# Patient Record
Sex: Female | Born: 1975 | Race: White | Hispanic: No | Marital: Single | Smoking: Never smoker
Health system: Southern US, Community
[De-identification: ages and names within clinical notes are randomized; demographics above are authoritative.]

## PROBLEM LIST (undated history)

## (undated) DIAGNOSIS — A4902 Methicillin resistant Staphylococcus aureus infection, unspecified site: Secondary | ICD-10-CM

## (undated) DIAGNOSIS — F988 Other specified behavioral and emotional disorders with onset usually occurring in childhood and adolescence: Secondary | ICD-10-CM

## (undated) DIAGNOSIS — K259 Gastric ulcer, unspecified as acute or chronic, without hemorrhage or perforation: Secondary | ICD-10-CM

## (undated) DIAGNOSIS — N281 Cyst of kidney, acquired: Secondary | ICD-10-CM

## (undated) DIAGNOSIS — F1120 Opioid dependence, uncomplicated: Secondary | ICD-10-CM

## (undated) DIAGNOSIS — M549 Dorsalgia, unspecified: Secondary | ICD-10-CM

## (undated) HISTORY — PX: HEMORRHOID SURGERY: SHX153

---

## 1998-01-29 ENCOUNTER — Emergency Department (HOSPITAL_COMMUNITY): Admission: EM | Admit: 1998-01-29 | Discharge: 1998-01-29 | Payer: Self-pay | Admitting: Emergency Medicine

## 1999-01-28 ENCOUNTER — Emergency Department (HOSPITAL_COMMUNITY): Admission: EM | Admit: 1999-01-28 | Discharge: 1999-01-28 | Payer: Self-pay | Admitting: Emergency Medicine

## 1999-11-23 ENCOUNTER — Other Ambulatory Visit: Admission: RE | Admit: 1999-11-23 | Discharge: 1999-11-23 | Payer: Self-pay | Admitting: Gynecology

## 2001-05-12 ENCOUNTER — Other Ambulatory Visit: Admission: RE | Admit: 2001-05-12 | Discharge: 2001-05-12 | Payer: Self-pay | Admitting: *Deleted

## 2014-10-30 ENCOUNTER — Emergency Department (HOSPITAL_BASED_OUTPATIENT_CLINIC_OR_DEPARTMENT_OTHER)
Admission: EM | Admit: 2014-10-30 | Discharge: 2014-10-31 | Discharge status: Against Medical Advice | Payer: Medicaid Other | Attending: Emergency Medicine | Admitting: Emergency Medicine

## 2014-10-30 ENCOUNTER — Encounter (HOSPITAL_BASED_OUTPATIENT_CLINIC_OR_DEPARTMENT_OTHER): Payer: Self-pay

## 2014-10-30 DIAGNOSIS — M545 Low back pain: Secondary | ICD-10-CM | POA: Diagnosis not present

## 2014-10-30 HISTORY — DX: Cyst of kidney, acquired: N28.1

## 2014-10-30 HISTORY — DX: Gastric ulcer, unspecified as acute or chronic, without hemorrhage or perforation: K25.9

## 2014-10-30 HISTORY — DX: Dorsalgia, unspecified: M54.9

## 2014-10-30 HISTORY — DX: Methicillin resistant Staphylococcus aureus infection, unspecified site: A49.02

## 2014-10-30 HISTORY — DX: Other specified behavioral and emotional disorders with onset usually occurring in childhood and adolescence: F98.8

## 2014-10-30 LAB — URINE MICROSCOPIC-ADD ON

## 2014-10-30 LAB — URINALYSIS, ROUTINE W REFLEX MICROSCOPIC
Bilirubin Urine: NEGATIVE
GLUCOSE, UA: NEGATIVE mg/dL
HGB URINE DIPSTICK: NEGATIVE
KETONES UR: NEGATIVE mg/dL
Nitrite: NEGATIVE
PROTEIN: NEGATIVE mg/dL
Specific Gravity, Urine: 1.026 (ref 1.005–1.030)
Urobilinogen, UA: 1 mg/dL (ref 0.0–1.0)
pH: 5 (ref 5.0–8.0)

## 2014-10-30 LAB — PREGNANCY, URINE: PREG TEST UR: NEGATIVE

## 2014-10-30 NOTE — ED Notes (Signed)
twisted back moving boxes yesterday-lower back pain

## 2014-10-30 NOTE — ED Notes (Signed)
Per registration pt left

## 2019-04-28 ENCOUNTER — Emergency Department (HOSPITAL_BASED_OUTPATIENT_CLINIC_OR_DEPARTMENT_OTHER): Payer: Medicaid Other

## 2019-04-28 ENCOUNTER — Emergency Department (HOSPITAL_BASED_OUTPATIENT_CLINIC_OR_DEPARTMENT_OTHER)
Admission: EM | Admit: 2019-04-28 | Discharge: 2019-04-29 | Disposition: A | Discharge status: Home / Self Care | Payer: Medicaid Other | Attending: Emergency Medicine | Admitting: Emergency Medicine

## 2019-04-28 ENCOUNTER — Other Ambulatory Visit: Payer: Self-pay

## 2019-04-28 ENCOUNTER — Encounter (HOSPITAL_BASED_OUTPATIENT_CLINIC_OR_DEPARTMENT_OTHER): Payer: Self-pay | Admitting: Emergency Medicine

## 2019-04-28 DIAGNOSIS — L02413 Cutaneous abscess of right upper limb: Secondary | ICD-10-CM | POA: Diagnosis not present

## 2019-04-28 DIAGNOSIS — Z23 Encounter for immunization: Secondary | ICD-10-CM | POA: Insufficient documentation

## 2019-04-28 DIAGNOSIS — L03113 Cellulitis of right upper limb: Secondary | ICD-10-CM

## 2019-04-28 LAB — CBC WITH DIFFERENTIAL/PLATELET
Abs Immature Granulocytes: 0.05 10*3/uL (ref 0.00–0.07)
Basophils Absolute: 0 10*3/uL (ref 0.0–0.1)
Basophils Relative: 0 %
Eosinophils Absolute: 0.1 10*3/uL (ref 0.0–0.5)
Eosinophils Relative: 2 %
HCT: 36.6 % (ref 36.0–46.0)
Hemoglobin: 11.9 g/dL — ABNORMAL LOW (ref 12.0–15.0)
Immature Granulocytes: 1 %
Lymphocytes Relative: 15 %
Lymphs Abs: 1.2 10*3/uL (ref 0.7–4.0)
MCH: 29.5 pg (ref 26.0–34.0)
MCHC: 32.5 g/dL (ref 30.0–36.0)
MCV: 90.6 fL (ref 80.0–100.0)
Monocytes Absolute: 0.6 10*3/uL (ref 0.1–1.0)
Monocytes Relative: 8 %
Neutro Abs: 5.8 10*3/uL (ref 1.7–7.7)
Neutrophils Relative %: 74 %
Platelets: 347 10*3/uL (ref 150–400)
RBC: 4.04 MIL/uL (ref 3.87–5.11)
RDW: 13.2 % (ref 11.5–15.5)
WBC: 7.8 10*3/uL (ref 4.0–10.5)
nRBC: 0 % (ref 0.0–0.2)

## 2019-04-28 LAB — URINALYSIS, ROUTINE W REFLEX MICROSCOPIC
Bilirubin Urine: NEGATIVE
Glucose, UA: NEGATIVE mg/dL
Hgb urine dipstick: NEGATIVE
Ketones, ur: NEGATIVE mg/dL
Leukocytes,Ua: NEGATIVE
Nitrite: NEGATIVE
Protein, ur: NEGATIVE mg/dL
Specific Gravity, Urine: >1.03 — ABNORMAL HIGH (ref 1.005–1.030)
pH: 5.5 (ref 5.0–8.0)

## 2019-04-28 LAB — COMPREHENSIVE METABOLIC PANEL
ALT: 16 U/L (ref 0–44)
AST: 16 U/L (ref 15–41)
Albumin: 3.4 g/dL — ABNORMAL LOW (ref 3.5–5.0)
Alkaline Phosphatase: 83 U/L (ref 38–126)
Anion gap: 11 (ref 5–15)
BUN: 13 mg/dL (ref 6–20)
CO2: 27 mmol/L (ref 22–32)
Calcium: 9 mg/dL (ref 8.9–10.3)
Chloride: 99 mmol/L (ref 98–111)
Creatinine, Ser: 0.67 mg/dL (ref 0.44–1.00)
GFR calc Af Amer: >60 mL/min (ref >60)
GFR calc non Af Amer: >60 mL/min (ref >60)
Glucose, Bld: 95 mg/dL (ref 70–99)
Potassium: 3.6 mmol/L (ref 3.5–5.1)
Sodium: 137 mmol/L (ref 135–145)
Total Bilirubin: 0.4 mg/dL (ref 0.3–1.2)
Total Protein: 7.7 g/dL (ref 6.5–8.1)

## 2019-04-28 LAB — PROTIME-INR
INR: 1 (ref 0.8–1.2)
Prothrombin Time: 12.7 seconds (ref 11.4–15.2)

## 2019-04-28 LAB — LACTIC ACID, PLASMA: Lactic Acid, Venous: 1 mmol/L (ref 0.5–1.9)

## 2019-04-28 LAB — APTT: aPTT: 33 seconds (ref 24–36)

## 2019-04-28 LAB — PREGNANCY, URINE: Preg Test, Ur: NEGATIVE

## 2019-04-28 MED ORDER — SODIUM CHLORIDE 0.9 % IV BOLUS (SEPSIS)
500.0000 mL | Freq: Once | INTRAVENOUS | Status: AC
  Administered 2019-04-28: 500 mL via INTRAVENOUS

## 2019-04-28 MED ORDER — VANCOMYCIN HCL IN DEXTROSE 750-5 MG/150ML-% IV SOLN
750.0000 mg | Freq: Two times a day (BID) | INTRAVENOUS | Status: DC
  Filled 2019-04-28: qty 150

## 2019-04-28 MED ORDER — SODIUM CHLORIDE 0.9 % IV BOLUS (SEPSIS)
1000.0000 mL | Freq: Once | INTRAVENOUS | Status: AC
  Administered 2019-04-28: 1000 mL via INTRAVENOUS

## 2019-04-28 MED ORDER — SODIUM CHLORIDE 0.9 % IV SOLN
2.0000 g | Freq: Once | INTRAVENOUS | Status: AC
  Administered 2019-04-28: 2 g via INTRAVENOUS
  Filled 2019-04-28: qty 20

## 2019-04-28 MED ORDER — VANCOMYCIN HCL IN DEXTROSE 1-5 GM/200ML-% IV SOLN
1000.0000 mg | Freq: Once | INTRAVENOUS | Status: AC
  Administered 2019-04-28: 1000 mg via INTRAVENOUS
  Filled 2019-04-28: qty 200

## 2019-04-28 MED ORDER — IBUPROFEN 600 MG PO TABS: 600.0000 mg | ORAL_TABLET | Freq: Four times a day (QID) | ORAL | 0 refills | Status: AC | PRN

## 2019-04-28 MED ORDER — MORPHINE SULFATE (PF) 4 MG/ML IV SOLN
4.0000 mg | Freq: Once | INTRAVENOUS | Status: AC
  Administered 2019-04-28: 4 mg via INTRAVENOUS
  Filled 2019-04-28: qty 1

## 2019-04-28 MED ORDER — SODIUM CHLORIDE 0.9 % IV BOLUS (SEPSIS)
250.0000 mL | Freq: Once | INTRAVENOUS | Status: AC
  Administered 2019-04-28: 250 mL via INTRAVENOUS

## 2019-04-28 MED ORDER — SODIUM CHLORIDE 0.9 % IV SOLN
INTRAVENOUS | Status: DC | PRN
  Administered 2019-04-28: 250 mL via INTRAVENOUS

## 2019-04-28 MED ORDER — TETANUS-DIPHTH-ACELL PERTUSSIS 5-2.5-18.5 LF-MCG/0.5 IM SUSP
0.5000 mL | Freq: Once | INTRAMUSCULAR | Status: AC
  Administered 2019-04-28: 0.5 mL via INTRAMUSCULAR
  Filled 2019-04-28: qty 0.5

## 2019-04-28 MED ORDER — LIDOCAINE-EPINEPHRINE (PF) 2 %-1:200000 IJ SOLN
10.0000 mL | Freq: Once | INTRAMUSCULAR | Status: AC
  Administered 2019-04-28: 10 mL via INTRADERMAL
  Filled 2019-04-28 (×2): qty 10

## 2019-04-28 MED ORDER — CLINDAMYCIN HCL 300 MG PO CAPS: 300.0000 mg | ORAL_CAPSULE | Freq: Four times a day (QID) | ORAL | 0 refills | Status: DC

## 2019-04-28 NOTE — ED Triage Notes (Addendum)
Pt states that she has been trying to stop using IV drugs. Her suboxone was stolen so she shot up 2 days ago. Now she has a swollen abscess looking area to her right AC area. She has not shot up in that arm though. 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

## 2019-04-28 NOTE — ED Provider Notes (Signed)
[facility] Provider Note   CSN: [id number] Arrival date & time: 04/28/19  2050     History   Chief Complaint Chief Complaint  Patient presents with  . Abscess    HPI Crystal Aguirre is a 43 y.o. female.     The history is provided by the patient. No language interpreter was used.  Abscess    43 year old female with history of IV drug use, MRSA, presenting for evaluation of skin infection.  Patient admits that she used IV drugs last use was 2 days ago.  She developed an infection to her right antecubital region approximately 4 days ago which has become progressively worse.  She tries taking leftover clindamycin at home without adequate relief.  She mention she is actively trying to stop using IV drugs but her Suboxone was recently stolen.  She is scheduled to follow-up with Dr. in 2 days for further care.  She currently denies having fever, headache, chest pain, trouble breathing.  She does complain of sharp throbbing 8 out of 10 pain throughout her right arm.  She is not up-to-date with tetanus.  Past Medical History:  Diagnosis Date  . ADD (attention deficit disorder)   . Back pain   . Gastric ulcer   . Kidney cysts   . MRSA infection     There are no active problems to display for this patient.   Past Surgical History:  Procedure Laterality Date  . HEMORRHOID SURGERY       OB History   No obstetric history on file.      Home Medications    Prior to Admission medications   Medication Sig Start Date End Date Taking? Authorizing Provider  Amphetamine-Dextroamphetamine (ADDERALL PO) Take by mouth.    [provider]  Dexlansoprazole (DEXILANT PO) Take by mouth.    [provider]  Oxycodone-Acetaminophen (PERCOCET PO) Take by mouth.    [provider]    Family History No family history on file.  Social History Social History   Tobacco Use  . Smoking status: Never Smoker  Substance Use Topics   . Alcohol use: Yes  . Drug use: No     Allergies   Sulfa antibiotics   Review of Systems Review of Systems  All other systems reviewed and are negative.    Physical Exam Updated Vital Signs BP 102/75   Pulse (!) 125   Temp 98.5 F (36.9 C) (Oral)   Resp 18   Ht 5\' 3"  (1.6 m)   Wt 56.2 kg   SpO2 97%   BMI 21.97 kg/m   Physical Exam Vitals signs and nursing note reviewed.  Constitutional:      General: She is not in acute distress.    Appearance: She is well-developed.  HENT:     Head: Atraumatic.  Eyes:     Conjunctiva/sclera: Conjunctivae normal.  Neck:     Musculoskeletal: Neck supple.  Cardiovascular:     Rate and Rhythm: Tachycardia present.     Heart sounds: Murmur present.  Pulmonary:     Effort: Pulmonary effort is normal.     Breath sounds: Normal breath sounds.  Abdominal:     General: Abdomen is flat.     Palpations: Abdomen is soft.     Tenderness: There is no abdominal tenderness.  Skin:    Findings: Rash present.     Comments: Right arm: Area of induration and fluctuant noted to the antecubital region approximately 5 cm in diameter exquisitely  tenderness to palpation.  Surrounding skin erythema expanding to most medial aspect of arm from proximal upper arm to distal forearm without hand involvement.  No circumferential cellulitis.  Radial pulse 2+.  Brisk cap refill to fingers.  Neurological:     Mental Status: She is alert.      ED Treatments / Results  Labs (all labs ordered are listed, but only abnormal results are displayed) Labs Reviewed  COMPREHENSIVE METABOLIC PANEL - Abnormal; Notable for the following components:      Result Value   Albumin 3.4 (*)    All other components within normal limits  CBC WITH DIFFERENTIAL/PLATELET - Abnormal; Notable for the following components:   Hemoglobin 11.9 (*)    All other components within normal limits  CULTURE, BLOOD (ROUTINE X 2)  CULTURE, BLOOD (ROUTINE X 2)  URINE CULTURE  AEROBIC  CULTURE (SUPERFICIAL SPECIMEN)  LACTIC ACID, PLASMA  APTT  PROTIME-INR  URINALYSIS, ROUTINE W REFLEX MICROSCOPIC  PREGNANCY, URINE    EKG EKG Interpretation  Date/Time:  Saturday April 28 2019 21:26:04 EDT Ventricular Rate:  102 PR Interval:    QRS Duration: 79 QT Interval:  319 QTC Calculation: 416 R Axis:   70 Text Interpretation:  Sinus tachycardia No prior ECG for comparison.  No STEMI Confirmed by Theda Belfastegeler, Chris ([id number]) on 04/28/2019 10:37:19 PM   Radiology Dg Chest Port 1 View  Result Date: 04/28/2019 CLINICAL DATA:  IV drug use. Heart murmur. EXAM: PORTABLE CHEST 1 VIEW COMPARISON:  None. FINDINGS: The cardiomediastinal contours are normal. Heart is normal in size. The lungs are clear. Pulmonary vasculature is normal. No consolidation, pleural effusion, or pneumothorax. No acute osseous abnormalities are seen. IMPRESSION: Negative portable AP view of the chest. Electronically Signed   By: Narda RutherfordMelanie  Sanford M.D.   On: 04/28/2019 21:42    Procedures .Marland Kitchen.Incision and Drainage  Date/Time: 04/28/2019 9:58 PM Performed by: Fayrene Helperran, Phyllistine Domingos, PA-C Authorized by: Fayrene Helperran, Elio Haden, PA-C   Consent:    Consent obtained:  Verbal   Consent given by:  Patient   Risks discussed:  Bleeding, incomplete drainage, pain and damage to other organs   Alternatives discussed:  No treatment Universal protocol:    Procedure explained and questions answered to patient or proxy's satisfaction: yes     Relevant documents present and verified: yes     Test results available and properly labeled: yes     Imaging studies available: yes     Required blood products, implants, devices, and special equipment available: yes     Site/side marked: yes     Immediately prior to procedure a time out was called: yes     Patient identity confirmed:  Verbally with patient Location:    Type:  Abscess   Size:  5cm   Location:  Upper extremity   Upper extremity location:  Elbow   Elbow location:  R elbow (R  antecubital) Pre-procedure details:    Skin preparation:  Betadine Anesthesia (see MAR for exact dosages):    Anesthesia method:  Local infiltration   Local anesthetic:  Lidocaine 1% WITH epi Procedure type:    Complexity:  Complex Procedure details:    Incision types:  Single straight   Incision depth:  Subcutaneous   Scalpel blade:  11   Wound management:  Probed and deloculated, irrigated with saline and extensive cleaning   Drainage:  Purulent   Drainage amount:  Copious   Packing materials:  1/4 in gauze and 1/2 in iodoform gauze Post-procedure details:  Patient tolerance of procedure:  Tolerated with difficulty   (including critical care time)  Medications Ordered in ED Medications  vancomycin (VANCOCIN) IVPB 1000 mg/200 mL premix (1,000 mg Intravenous New Bag/Given 04/28/19 2224)  0.9 %  sodium chloride infusion (250 mLs Intravenous New Bag/Given 04/28/19 2222)  vancomycin (VANCOCIN) IVPB 750 mg/150 ml premix (has no administration in time range)  Tdap (BOOSTRIX) injection 0.5 mL (0.5 mLs Intramuscular Given 04/28/19 2227)  sodium chloride 0.9 % bolus 1,000 mL (1,000 mLs Intravenous New Bag/Given 04/28/19 2211)    And  sodium chloride 0.9 % bolus 500 mL (500 mLs Intravenous New Bag/Given 04/28/19 2216)    And  sodium chloride 0.9 % bolus 250 mL (0 mLs Intravenous Stopped 04/28/19 2217)  cefTRIAXone (ROCEPHIN) 2 g in sodium chloride 0.9 % 100 mL IVPB (2 g Intravenous New Bag/Given 04/28/19 2216)  lidocaine-EPINEPHrine (XYLOCAINE W/EPI) 2 %-1:200000 (PF) injection 10 mL (10 mLs Intradermal Given by Other 04/28/19 2155)  morphine 4 MG/ML injection 4 mg (4 mg Intravenous Given 04/28/19 2206)     Initial Impression / Assessment and Plan / ED Course  I have reviewed the triage vital signs and the nursing notes.  Pertinent labs & imaging results that were available during my care of the patient were reviewed by me and considered in my medical decision making (see chart for details).         BP 117/77   Pulse 96   Temp 98.5 F (36.9 C) (Oral)   Resp 13   Ht 5\' 3"  (1.6 m)   Wt 56.2 kg   SpO2 100%   BMI 21.97 kg/m    Final Clinical Impressions(s) / ED Diagnoses   Final diagnoses:  Abscess of upper arm and forearm, right  Cellulitis of right arm    ED Discharge Orders         Ordered    clindamycin (CLEOCIN) 300 MG capsule  4 times daily     04/28/19 2245    ibuprofen (ADVIL) 600 MG tablet  Every 6 hours PRN     04/28/19 2245         9:27 PM Patient with significant history of IV drug use here with right arm infection.  She developed a cutaneous abscess to her right AC region with surrounding skin cellulitis involving most of her upper arm.  She was found to be tachycardic with heart rate of 125.  Soft blood pressure with a blood pressure of 102/75.  She is afebrile.  She does have heart murmur on exam but no active chest pain to suggest endocarditis.  Code sepsis initiated, will initiate antibiotic and will perform incision and drainage of her abscess.  9:59 PM Successful incision and drainage of the abscess.  Copious amount of purulent discharge noted.  Patient tolerates without difficulty.  10:01 PM Fortunately white count is normal, lactic acid is normal, electrolyte panels are reassuring and chest x-ray unremarkable. Code sepsis cancelled.  This patient does not want to be admitted to the hospital, will continue with antibiotic and discharge home with antibiotic with close follow-up.  Packing to be removed in 2 days. Since pt started on the first day of clindamycin, will continue with this antibiotic.     Fayrene Helperran, Lennyn Gange, PA-C 04/28/19 2247    Tegeler, Canary Brimhristopher J, MD 04/29/19 Marlyne [id number]

## 2019-04-28 NOTE — Discharge Instructions (Signed)
You have been diagnosed with an abscess to your right elbow with surrounding cellulitis.  Apply warm moist compress to affected area several times daily to help with healing.  Return to the ER in 2 days for wound recheck and packing removal.  Return sooner if you have any concerns.

## 2019-04-28 NOTE — Progress Notes (Signed)
Pharmacy Antibiotic Note  Crystal Aguirre is a 43 y.o. female admitted on 04/28/2019 with cellulitis.  Pharmacy has been consulted for vancomycin dosing. Pt has hx IVDU, Cr <1, cultures pending.  Plan: Vancomycin 1000mg  IV x1 then 750mg  IV q12h Ceftriaxone 2g IV  x1 per MD  Height: 5\' 3"  (160 cm) Weight: 124 lb (56.2 kg) IBW/kg (Calculated) : 52.4  Temp (24hrs), Avg:98.5 F (36.9 C), Min:98.5 F (36.9 C), Max:98.5 F (36.9 C)  Recent Labs  Lab 04/28/19 2200  WBC 7.8  CREATININE 0.67  LATICACIDVEN 1.0    Estimated Creatinine Clearance: 75.8 mL/min (by C-G formula based on SCr of 0.67 mg/dL).    Allergies  Allergen Reactions  . Sulfa Antibiotics Other (See Comments)    Joint pain    Antimicrobials this admission: Vancomycin 8/15 >>  Microbiology results: sent  Thank you for allowing pharmacy to be a part of this patient's care.   Arrie Senate, PharmD, BCPS Clinical Pharmacist Please check AMION for all [facility] Pharm[facility] numbers 04/28/2019

## 2019-04-28 NOTE — ED Triage Notes (Signed)
One day of PCN and then another day of clindamycin

## 2019-04-30 LAB — URINE CULTURE: Culture: NO GROWTH

## 2019-05-01 LAB — AEROBIC CULTURE W GRAM STAIN (SUPERFICIAL SPECIMEN): Culture: NORMAL

## 2019-05-04 LAB — CULTURE, BLOOD (ROUTINE X 2)
Culture: NO GROWTH
Culture: NO GROWTH
Special Requests: ADEQUATE
Special Requests: ADEQUATE

## 2019-06-20 ENCOUNTER — Encounter (HOSPITAL_BASED_OUTPATIENT_CLINIC_OR_DEPARTMENT_OTHER): Payer: Self-pay | Admitting: *Deleted

## 2019-06-20 ENCOUNTER — Other Ambulatory Visit: Payer: Self-pay

## 2019-06-20 ENCOUNTER — Inpatient Hospital Stay (HOSPITAL_BASED_OUTPATIENT_CLINIC_OR_DEPARTMENT_OTHER)
Admission: EM | Admit: 2019-06-20 | Discharge: 2019-06-24 | DRG: 442 | Discharge status: Against Medical Advice | Payer: Medicaid Other | Attending: Internal Medicine | Admitting: Internal Medicine

## 2019-06-20 DIAGNOSIS — F1910 Other psychoactive substance abuse, uncomplicated: Secondary | ICD-10-CM | POA: Diagnosis present

## 2019-06-20 DIAGNOSIS — Z79899 Other long term (current) drug therapy: Secondary | ICD-10-CM

## 2019-06-20 DIAGNOSIS — E876 Hypokalemia: Secondary | ICD-10-CM | POA: Diagnosis present

## 2019-06-20 DIAGNOSIS — G2581 Restless legs syndrome: Secondary | ICD-10-CM | POA: Diagnosis present

## 2019-06-20 DIAGNOSIS — D689 Coagulation defect, unspecified: Secondary | ICD-10-CM | POA: Diagnosis present

## 2019-06-20 DIAGNOSIS — Z791 Long term (current) use of non-steroidal anti-inflammatories (NSAID): Secondary | ICD-10-CM

## 2019-06-20 DIAGNOSIS — R7689 Other specified abnormal immunological findings in serum: Secondary | ICD-10-CM | POA: Diagnosis present

## 2019-06-20 DIAGNOSIS — B179 Acute viral hepatitis, unspecified: Secondary | ICD-10-CM

## 2019-06-20 DIAGNOSIS — R7401 Elevation of levels of liver transaminase levels: Secondary | ICD-10-CM

## 2019-06-20 DIAGNOSIS — F988 Other specified behavioral and emotional disorders with onset usually occurring in childhood and adolescence: Secondary | ICD-10-CM | POA: Diagnosis present

## 2019-06-20 DIAGNOSIS — R768 Other specified abnormal immunological findings in serum: Secondary | ICD-10-CM | POA: Diagnosis present

## 2019-06-20 DIAGNOSIS — Z20828 Contact with and (suspected) exposure to other viral communicable diseases: Secondary | ICD-10-CM | POA: Diagnosis present

## 2019-06-20 DIAGNOSIS — E871 Hypo-osmolality and hyponatremia: Secondary | ICD-10-CM | POA: Diagnosis present

## 2019-06-20 DIAGNOSIS — Z5329 Procedure and treatment not carried out because of patient's decision for other reasons: Secondary | ICD-10-CM | POA: Diagnosis not present

## 2019-06-20 DIAGNOSIS — B159 Hepatitis A without hepatic coma: Secondary | ICD-10-CM | POA: Diagnosis present

## 2019-06-20 DIAGNOSIS — B1710 Acute hepatitis C without hepatic coma: Principal | ICD-10-CM | POA: Diagnosis present

## 2019-06-20 DIAGNOSIS — F1110 Opioid abuse, uncomplicated: Secondary | ICD-10-CM | POA: Diagnosis present

## 2019-06-20 DIAGNOSIS — E860 Dehydration: Secondary | ICD-10-CM | POA: Diagnosis present

## 2019-06-20 DIAGNOSIS — E861 Hypovolemia: Secondary | ICD-10-CM | POA: Diagnosis present

## 2019-06-20 DIAGNOSIS — E279 Disorder of adrenal gland, unspecified: Secondary | ICD-10-CM | POA: Diagnosis present

## 2019-06-20 HISTORY — DX: Opioid dependence, uncomplicated: F11.20

## 2019-06-20 NOTE — ED Triage Notes (Signed)
Pt c/o n/v x 2 days, recently stop heroin use x 3 days ago

## 2019-06-21 ENCOUNTER — Emergency Department (HOSPITAL_BASED_OUTPATIENT_CLINIC_OR_DEPARTMENT_OTHER): Payer: Medicaid Other

## 2019-06-21 ENCOUNTER — Encounter (HOSPITAL_COMMUNITY): Payer: Self-pay | Admitting: Family Medicine

## 2019-06-21 ENCOUNTER — Inpatient Hospital Stay (HOSPITAL_BASED_OUTPATIENT_CLINIC_OR_DEPARTMENT_OTHER): Payer: Medicaid Other

## 2019-06-21 DIAGNOSIS — E871 Hypo-osmolality and hyponatremia: Secondary | ICD-10-CM | POA: Diagnosis present

## 2019-06-21 DIAGNOSIS — D689 Coagulation defect, unspecified: Secondary | ICD-10-CM | POA: Diagnosis present

## 2019-06-21 DIAGNOSIS — E279 Disorder of adrenal gland, unspecified: Secondary | ICD-10-CM | POA: Diagnosis present

## 2019-06-21 DIAGNOSIS — Z20828 Contact with and (suspected) exposure to other viral communicable diseases: Secondary | ICD-10-CM | POA: Diagnosis present

## 2019-06-21 DIAGNOSIS — B1710 Acute hepatitis C without hepatic coma: Secondary | ICD-10-CM | POA: Diagnosis present

## 2019-06-21 DIAGNOSIS — F1110 Opioid abuse, uncomplicated: Secondary | ICD-10-CM | POA: Diagnosis present

## 2019-06-21 DIAGNOSIS — B179 Acute viral hepatitis, unspecified: Secondary | ICD-10-CM | POA: Diagnosis present

## 2019-06-21 DIAGNOSIS — Z791 Long term (current) use of non-steroidal anti-inflammatories (NSAID): Secondary | ICD-10-CM | POA: Diagnosis not present

## 2019-06-21 DIAGNOSIS — F988 Other specified behavioral and emotional disorders with onset usually occurring in childhood and adolescence: Secondary | ICD-10-CM | POA: Diagnosis present

## 2019-06-21 DIAGNOSIS — Z79899 Other long term (current) drug therapy: Secondary | ICD-10-CM | POA: Diagnosis not present

## 2019-06-21 DIAGNOSIS — Z5329 Procedure and treatment not carried out because of patient's decision for other reasons: Secondary | ICD-10-CM | POA: Diagnosis not present

## 2019-06-21 DIAGNOSIS — F1910 Other psychoactive substance abuse, uncomplicated: Secondary | ICD-10-CM | POA: Diagnosis present

## 2019-06-21 DIAGNOSIS — E876 Hypokalemia: Secondary | ICD-10-CM | POA: Diagnosis present

## 2019-06-21 DIAGNOSIS — E860 Dehydration: Secondary | ICD-10-CM | POA: Diagnosis present

## 2019-06-21 DIAGNOSIS — R7401 Elevation of levels of liver transaminase levels: Secondary | ICD-10-CM | POA: Diagnosis not present

## 2019-06-21 DIAGNOSIS — F1990 Other psychoactive substance use, unspecified, uncomplicated: Secondary | ICD-10-CM | POA: Diagnosis not present

## 2019-06-21 DIAGNOSIS — B159 Hepatitis A without hepatic coma: Secondary | ICD-10-CM | POA: Diagnosis present

## 2019-06-21 DIAGNOSIS — E861 Hypovolemia: Secondary | ICD-10-CM | POA: Diagnosis present

## 2019-06-21 DIAGNOSIS — G2581 Restless legs syndrome: Secondary | ICD-10-CM | POA: Diagnosis present

## 2019-06-21 LAB — COMPREHENSIVE METABOLIC PANEL
ALT: 3726 U/L — ABNORMAL HIGH (ref 0–44)
ALT: 4634 U/L — ABNORMAL HIGH (ref 0–44)
AST: 3050 U/L — ABNORMAL HIGH (ref 15–41)
AST: 4249 U/L — ABNORMAL HIGH (ref 15–41)
Albumin: 2.9 g/dL — ABNORMAL LOW (ref 3.5–5.0)
Albumin: 3.3 g/dL — ABNORMAL LOW (ref 3.5–5.0)
Alkaline Phosphatase: 216 U/L — ABNORMAL HIGH (ref 38–126)
Alkaline Phosphatase: 255 U/L — ABNORMAL HIGH (ref 38–126)
Anion gap: 11 (ref 5–15)
Anion gap: 11 (ref 5–15)
BUN: 10 mg/dL (ref 6–20)
BUN: 9 mg/dL (ref 6–20)
CO2: 24 mmol/L (ref 22–32)
CO2: 25 mmol/L (ref 22–32)
Calcium: 8.4 mg/dL — ABNORMAL LOW (ref 8.9–10.3)
Calcium: 8.7 mg/dL — ABNORMAL LOW (ref 8.9–10.3)
Chloride: 95 mmol/L — ABNORMAL LOW (ref 98–111)
Chloride: 96 mmol/L — ABNORMAL LOW (ref 98–111)
Creatinine, Ser: 0.53 mg/dL (ref 0.44–1.00)
Creatinine, Ser: 0.58 mg/dL (ref 0.44–1.00)
GFR calc Af Amer: >60 mL/min (ref >60)
GFR calc Af Amer: >60 mL/min (ref >60)
GFR calc non Af Amer: >60 mL/min (ref >60)
GFR calc non Af Amer: >60 mL/min (ref >60)
Glucose, Bld: 119 mg/dL — ABNORMAL HIGH (ref 70–99)
Glucose, Bld: 67 mg/dL — ABNORMAL LOW (ref 70–99)
Potassium: 3.8 mmol/L (ref 3.5–5.1)
Potassium: 4.1 mmol/L (ref 3.5–5.1)
Sodium: 131 mmol/L — ABNORMAL LOW (ref 135–145)
Sodium: 131 mmol/L — ABNORMAL LOW (ref 135–145)
Total Bilirubin: 6.1 mg/dL — ABNORMAL HIGH (ref 0.3–1.2)
Total Bilirubin: 6.4 mg/dL — ABNORMAL HIGH (ref 0.3–1.2)
Total Protein: 6.9 g/dL (ref 6.5–8.1)
Total Protein: 8 g/dL (ref 6.5–8.1)

## 2019-06-21 LAB — CBC WITH DIFFERENTIAL/PLATELET
Abs Immature Granulocytes: 0.04 10*3/uL (ref 0.00–0.07)
Abs Immature Granulocytes: 0.05 10*3/uL (ref 0.00–0.07)
Basophils Absolute: 0 10*3/uL (ref 0.0–0.1)
Basophils Absolute: 0 10*3/uL (ref 0.0–0.1)
Basophils Relative: 1 %
Basophils Relative: 1 %
Eosinophils Absolute: 0 10*3/uL (ref 0.0–0.5)
Eosinophils Absolute: 0.1 10*3/uL (ref 0.0–0.5)
Eosinophils Relative: 0 %
Eosinophils Relative: 2 %
HCT: 42.5 % (ref 36.0–46.0)
HCT: 44.4 % (ref 36.0–46.0)
Hemoglobin: 14 g/dL (ref 12.0–15.0)
Hemoglobin: 14.5 g/dL (ref 12.0–15.0)
Immature Granulocytes: 1 %
Immature Granulocytes: 1 %
Lymphocytes Relative: 28 %
Lymphocytes Relative: 29 %
Lymphs Abs: 1.1 10*3/uL (ref 0.7–4.0)
Lymphs Abs: 1.3 10*3/uL (ref 0.7–4.0)
MCH: 28.1 pg (ref 26.0–34.0)
MCH: 28.2 pg (ref 26.0–34.0)
MCHC: 32.7 g/dL (ref 30.0–36.0)
MCHC: 32.9 g/dL (ref 30.0–36.0)
MCV: 85.2 fL (ref 80.0–100.0)
MCV: 86.2 fL (ref 80.0–100.0)
Monocytes Absolute: 0.4 10*3/uL (ref 0.1–1.0)
Monocytes Absolute: 0.5 10*3/uL (ref 0.1–1.0)
Monocytes Relative: 10 %
Monocytes Relative: 10 %
Neutro Abs: 2.2 10*3/uL (ref 1.7–7.7)
Neutro Abs: 2.8 10*3/uL (ref 1.7–7.7)
Neutrophils Relative %: 57 %
Neutrophils Relative %: 60 %
Platelets: 291 10*3/uL (ref 150–400)
Platelets: 306 10*3/uL (ref 150–400)
RBC: 4.99 MIL/uL (ref 3.87–5.11)
RBC: 5.15 MIL/uL — ABNORMAL HIGH (ref 3.87–5.11)
RDW: 14.3 % (ref 11.5–15.5)
RDW: 14.5 % (ref 11.5–15.5)
Smear Review: NORMAL
WBC Morphology: ABNORMAL
WBC: 3.8 10*3/uL — ABNORMAL LOW (ref 4.0–10.5)
WBC: 4.6 10*3/uL (ref 4.0–10.5)
nRBC: 0 % (ref 0.0–0.2)
nRBC: 0 % (ref 0.0–0.2)

## 2019-06-21 LAB — URINALYSIS, ROUTINE W REFLEX MICROSCOPIC
Glucose, UA: 100 mg/dL — AB
Hgb urine dipstick: NEGATIVE
Ketones, ur: NEGATIVE mg/dL
Nitrite: NEGATIVE
Protein, ur: NEGATIVE mg/dL
Specific Gravity, Urine: >1.03 — ABNORMAL HIGH (ref 1.005–1.030)
pH: 6.5 (ref 5.0–8.0)

## 2019-06-21 LAB — URINALYSIS, MICROSCOPIC (REFLEX): RBC / HPF: NONE SEEN RBC/hpf (ref 0–5)

## 2019-06-21 LAB — PROTIME-INR
INR: 2.2 — ABNORMAL HIGH (ref 0.8–1.2)
INR: 2.1 — ABNORMAL HIGH (ref 0.8–1.2)
Prothrombin Time: 24.5 seconds — ABNORMAL HIGH (ref 11.4–15.2)
Prothrombin Time: 23.1 seconds — ABNORMAL HIGH (ref 11.4–15.2)

## 2019-06-21 LAB — AMMONIA: Ammonia: 23 umol/L (ref 9–35)

## 2019-06-21 LAB — APTT: aPTT: 42 seconds — ABNORMAL HIGH (ref 24–36)

## 2019-06-21 LAB — ACETAMINOPHEN LEVEL
Acetaminophen (Tylenol), Serum: <10 ug/mL — ABNORMAL LOW (ref 10–30)
Acetaminophen (Tylenol), Serum: <10 ug/mL — ABNORMAL LOW (ref 10–30)

## 2019-06-21 LAB — SARS CORONAVIRUS 2 BY RT PCR (HOSPITAL ORDER, PERFORMED IN ~~LOC~~ HOSPITAL LAB): SARS Coronavirus 2: NEGATIVE

## 2019-06-21 LAB — LIPASE, BLOOD
Lipase: 113 U/L — ABNORMAL HIGH (ref 11–51)
Lipase: 96 U/L — ABNORMAL HIGH (ref 11–51)

## 2019-06-21 LAB — LACTIC ACID, PLASMA: Lactic Acid, Venous: 1.5 mmol/L (ref 0.5–1.9)

## 2019-06-21 MED ORDER — ONDANSETRON HCL 4 MG/2ML IJ SOLN
4.0000 mg | Freq: Once | INTRAMUSCULAR | Status: AC
  Administered 2019-06-21: 4 mg via INTRAVENOUS
  Filled 2019-06-21: qty 2

## 2019-06-21 MED ORDER — ONDANSETRON HCL 4 MG/2ML IJ SOLN: 4.0000 mg | Freq: Four times a day (QID) | INTRAMUSCULAR | Status: DC | PRN

## 2019-06-21 MED ORDER — PIPERACILLIN-TAZOBACTAM 4.5 G IVPB
4.5000 g | Freq: Three times a day (TID) | INTRAVENOUS | Status: DC
  Filled 2019-06-21: qty 100

## 2019-06-21 MED ORDER — ACETAMINOPHEN 325 MG PO TABS: 650.0000 mg | ORAL_TABLET | Freq: Four times a day (QID) | ORAL | Status: DC | PRN

## 2019-06-21 MED ORDER — ONDANSETRON HCL 4 MG/2ML IJ SOLN
4.0000 mg | Freq: Four times a day (QID) | INTRAMUSCULAR | Status: DC | PRN
  Administered 2019-06-21 (×2): 4 mg via INTRAVENOUS
  Filled 2019-06-21 (×2): qty 2

## 2019-06-21 MED ORDER — HYDROMORPHONE HCL 1 MG/ML IJ SOLN: 0.5000 mg | INTRAMUSCULAR | Status: DC | PRN

## 2019-06-21 MED ORDER — IOHEXOL 300 MG/ML  SOLN
100.0000 mL | Freq: Once | INTRAMUSCULAR | Status: AC | PRN
  Administered 2019-06-21: 100 mL via INTRAVENOUS

## 2019-06-21 MED ORDER — PIPERACILLIN-TAZOBACTAM 3.375 G IVPB 30 MIN
3.3750 g | Freq: Four times a day (QID) | INTRAVENOUS | Status: DC
  Administered 2019-06-21 (×2): 3.375 g via INTRAVENOUS
  Filled 2019-06-21 (×4): qty 50

## 2019-06-21 MED ORDER — ENOXAPARIN SODIUM 40 MG/0.4ML ~~LOC~~ SOLN: 40.0000 mg | SUBCUTANEOUS | Status: DC

## 2019-06-21 MED ORDER — CHLORHEXIDINE GLUCONATE CLOTH 2 % EX PADS: 6.0000 | MEDICATED_PAD | Freq: Every day | CUTANEOUS | Status: DC

## 2019-06-21 MED ORDER — HYDROMORPHONE HCL 1 MG/ML IJ SOLN
0.5000 mg | INTRAMUSCULAR | Status: DC | PRN
  Administered 2019-06-21 (×7): 0.5 mg via INTRAVENOUS
  Filled 2019-06-21 (×7): qty 1

## 2019-06-21 MED ORDER — LACTATED RINGERS IV BOLUS
1000.0000 mL | Freq: Once | INTRAVENOUS | Status: AC
  Administered 2019-06-21: 1000 mL via INTRAVENOUS

## 2019-06-21 MED ORDER — ACETAMINOPHEN 650 MG RE SUPP: 650.0000 mg | Freq: Four times a day (QID) | RECTAL | Status: DC | PRN

## 2019-06-21 MED ORDER — PIPERACILLIN-TAZOBACTAM 3.375 G IVPB 30 MIN
3.3750 g | Freq: Four times a day (QID) | INTRAVENOUS | Status: DC
  Filled 2019-06-21 (×2): qty 50

## 2019-06-21 MED ORDER — SODIUM CHLORIDE 0.9 % IV SOLN
INTRAVENOUS | Status: AC
  Administered 2019-06-21: 23:00:00 via INTRAVENOUS

## 2019-06-21 MED ORDER — PIPERACILLIN-TAZOBACTAM 3.375 G IVPB 30 MIN
3.3750 g | Freq: Once | INTRAVENOUS | Status: AC
  Administered 2019-06-21: 3.375 g via INTRAVENOUS
  Filled 2019-06-21 (×2): qty 50

## 2019-06-21 MED ORDER — HYDROMORPHONE HCL 2 MG PO TABS
2.0000 mg | ORAL_TABLET | Freq: Four times a day (QID) | ORAL | Status: DC | PRN
  Administered 2019-06-21: 23:00:00 4 mg via ORAL
  Administered 2019-06-22 (×4): 2 mg via ORAL
  Administered 2019-06-23 – 2019-06-24 (×6): 4 mg via ORAL
  Filled 2019-06-21 (×2): qty 1
  Filled 2019-06-21: qty 2
  Filled 2019-06-21 (×2): qty 1
  Filled 2019-06-21 (×3): qty 2
  Filled 2019-06-21: qty 1
  Filled 2019-06-21: qty 2
  Filled 2019-06-21: qty 1
  Filled 2019-06-21: qty 2

## 2019-06-21 MED ORDER — HYDROMORPHONE HCL 1 MG/ML IJ SOLN
0.5000 mg | Freq: Once | INTRAMUSCULAR | Status: AC
  Administered 2019-06-21: 02:00:00 0.5 mg via INTRAVENOUS
  Filled 2019-06-21: qty 1

## 2019-06-21 MED ORDER — SODIUM CHLORIDE 0.9 % IV SOLN
Freq: Once | INTRAVENOUS | Status: AC
  Administered 2019-06-21: 10:00:00 via INTRAVENOUS

## 2019-06-21 MED ORDER — METOCLOPRAMIDE HCL 5 MG/ML IJ SOLN
5.0000 mg | Freq: Once | INTRAMUSCULAR | Status: AC
  Administered 2019-06-21: 02:00:00 5 mg via INTRAVENOUS
  Filled 2019-06-21: qty 2

## 2019-06-21 NOTE — ED Provider Notes (Signed)
Clinical Course as of Jun 21 2015  Thu Jun 21, 2019  6945 Sign out received.  Briefly 43 yo female  w/ hx of IVDA presenting with acute transaminitis, concerning for hepatitis.  Admitted overnight, awaiting transport to [facility]   [MT]  0848 On my reassessment, patient having bedside U/S performed.  Her pain is under control.  Mentating well.  No scleral icterus, asterixes, or significant skin jaundice.  No pruritis.  I spoke to the hospitalist Dr. Posey Pronto who recommends talking to GI while patient is pending transfer, to ask for any additional recommendations - GI consult placed   [MT]  0850 Hypotension I suspect is close to her baseline given her stature, and recent IV pain medications, she is mentating well.  Will monitor BP   [MT]  0904 I spoke to Dr. Watt Climes of GI and reviewed the case.  His suspicion is for hepatitis.  He has no other urgent recommendations and states supportive care for now.     [MT]  1146 HCV Ab(!): Reactive [MT]  1146 Hep A Ab, IgM(!): Reactive [MT]  1432 Transaminitis improving.   [MT]    Clinical Course User Index [MT] Yaasir Menken, Carola Rhine, MD     Wyvonnia Dusky, MD 06/21/19 2016

## 2019-06-21 NOTE — ED Notes (Signed)
Pt ambulatory to bathroom without difficulty.  

## 2019-06-21 NOTE — ED Notes (Signed)
Zofran given for c/o nausea. No emesis

## 2019-06-21 NOTE — Progress Notes (Signed)
Patient discussed with the ER physician labs reviewed she has probably acute a on top of chronic C please draw a genotype and quantitative hep C antibody and call ID to see if she needs any acute therapies other than support and please let me know if I can help with this hospital stay

## 2019-06-21 NOTE — H&P (Addendum)
History and Physical    Crystal Aguirre XKG:[id number] DOB: [birth year] DOA: 06/20/2019  PCP: Curly Rim, MD   Patient coming from: Home   Chief Complaint: Upper abdominal pain, N/V   HPI: Crystal Aguirre is a 43 y.o. female with medical history significant for attention deficit disorder, history of heroin abuse, now presenting to the emergency department for evaluation of upper abdominal pain and nausea with nonbloody vomiting.  Patient reports few days of upper abdominal pain with nausea and nonbloody vomiting.  She has been struggling with drug abuse, has quit multiple times, last used 3 days ago, wants to achieve long-term abstinence.  She developed pain in the upper abdomen, more so on the right than the left, and has had nausea with nonbloody vomiting for the past 2 to 3 days.  She reports subjective fevers.  She denies any cough or diarrhea.  [facility] ED Course: Upon arrival to the ED, patient is found to be afebrile, saturating adequately on room air, and with systolic blood pressure of 84.  Chemistry panel is concerning for hyponatremia, total bilirubin 6.4, and transaminases in the 4000 range.  CBC is unremarkable.  Lactic acid is normal.  INR elevated to 2.2.  CT of the abdomen and pelvis with abnormal gallbladder appearance and right adrenal lesion.  Right upper quadrant ultrasound with marked diffuse gallbladder wall thickening, nonspecific.  COVID-19 testing negative.  Gastroenterology was consulted by the ED physician and recommended checking quantitative hepatitis C virus and genotype as well as discussing with ID.  Patient was given 2 L of lactated Ringer's, Reglan, and Zofran in the ED.  Review of Systems:  All other systems reviewed and apart from HPI, are negative.  Past Medical History:  Diagnosis Date   ADD (attention deficit disorder)    Back pain    Gastric ulcer    Kidney cysts    MRSA infection    Opiate addiction ([place])     Past Surgical  History:  Procedure Laterality Date   HEMORRHOID SURGERY       reports that she has never smoked. She has never used smokeless tobacco. She reports current alcohol use. She reports that she does not use drugs.  Allergies  Allergen Reactions   Latex Rash and Swelling   Penicillin G Hives   Soy Allergy Hives   Sulfa Antibiotics Other (See Comments) and Hives    Joint pain Other reaction(s): Unknown Joint pain    History reviewed. No pertinent family history.   Prior to Admission medications   Medication Sig Start Date End Date Taking? Authorizing Provider  acetaminophen (TYLENOL) 325 MG tablet Take 650 mg by mouth every 6 (six) hours as needed for moderate pain or headache.    Yes [provider]  DEXILANT 60 MG capsule Take 60 mg by mouth daily. 04/18/19  Yes [provider]  ibuprofen (ADVIL) 600 MG tablet Take 1 tablet (600 mg total) by mouth every 6 (six) hours as needed. Patient taking differently: Take 600 mg by mouth every 6 (six) hours as needed for headache or moderate pain.  04/28/19  Yes Domenic Moras, PA-C  Amphetamine-Dextroamphetamine (ADDERALL PO) Take by mouth.    [provider]    Physical Exam: Vitals:   06/21/19 1929 06/21/19 2000 06/21/19 2115 06/21/19 2200  BP:  104/66 113/72 103/60  Pulse:  71 79 78  Resp:  14 16 15   Temp: 97.7 F (36.5 C)     TempSrc: Oral  SpO2:  100% 100% 98%  Weight:      Height:        Constitutional: NAD, calm  Eyes: PERTLA, lids and conjunctivae normal ENMT: Mucous membranes are moist. Posterior pharynx clear of any exudate or lesions.   Neck: normal, supple, no masses, no thyromegaly Respiratory: no wheezing, no crackles. Normal respiratory effort. No accessory muscle use.  Cardiovascular: S1 & S2 heard, regular rate and rhythm. No extremity edema.   Abdomen: No distension, soft, tender in RUQ, no rebound pain or guarding. Bowel sounds active.  Musculoskeletal: no clubbing / cyanosis. No  joint deformity upper and lower extremities.   Skin: jaundice. Warm, dry, well-perfused. Neurologic: CN 2-12 grossly intact. Sensation intact. Strength 5/5 in all 4 limbs.  Psychiatric: Alert and oriented x 3. Calm, cooperative.     Labs on Admission: I have personally reviewed following labs and imaging studies  CBC: Recent Labs  Lab 06/21/19 0001 06/21/19 1220  WBC 3.8* 4.6  NEUTROABS 2.2 2.8  HGB 14.5 14.0  HCT 44.4 42.5  MCV 86.2 85.2  PLT 291 306   Basic Metabolic Panel: Recent Labs  Lab 06/21/19 0001 06/21/19 1220  NA 131* 131*  K 3.8 4.1  CL 95* 96*  CO2 25 24  GLUCOSE 119* 67*  BUN 10 9  CREATININE 0.58 0.53  CALCIUM 8.7* 8.4*   GFR: Estimated Creatinine Clearance: 75.8 mL/min (by C-G formula based on SCr of 0.53 mg/dL). Liver Function Tests: Recent Labs  Lab 06/21/19 0001 06/21/19 1220  AST 4,249* 3,050*  ALT 4,634* 3,726*  ALKPHOS 255* 216*  BILITOT 6.4* 6.1*  PROT 8.0 6.9  ALBUMIN 3.3* 2.9*   Recent Labs  Lab 06/21/19 0001 06/21/19 1220  LIPASE 96* 113*   Recent Labs  Lab 06/21/19 1220  AMMONIA 23   Coagulation Profile: Recent Labs  Lab 06/21/19 0226 06/21/19 1220  INR 2.2* 2.1*   Cardiac Enzymes: No results for input(s): CKTOTAL, CKMB, CKMBINDEX, TROPONINI in the last 168 hours. BNP (last 3 results) No results for input(s): PROBNP in the last 8760 hours. HbA1C: No results for input(s): HGBA1C in the last 72 hours. CBG: No results for input(s): GLUCAP in the last 168 hours. Lipid Profile: No results for input(s): CHOL, HDL, LDLCALC, TRIG, CHOLHDL, LDLDIRECT in the last 72 hours. Thyroid Function Tests: No results for input(s): TSH, T4TOTAL, FREET4, T3FREE, THYROIDAB in the last 72 hours. Anemia Panel: No results for input(s): VITAMINB12, FOLATE, FERRITIN, TIBC, IRON, RETICCTPCT in the last 72 hours. Urine analysis:    Component Value Date/Time   COLORURINE BROWN (A) 06/21/2019 0002   APPEARANCEUR CLOUDY (A) 06/21/2019 0002    LABSPEC >1.030 (H) 06/21/2019 0002   PHURINE 6.5 06/21/2019 0002   GLUCOSEU 100 (A) 06/21/2019 0002   HGBUR NEGATIVE 06/21/2019 0002   BILIRUBINUR LARGE (A) 06/21/2019 0002   KETONESUR NEGATIVE 06/21/2019 0002   PROTEINUR NEGATIVE 06/21/2019 0002   UROBILINOGEN 1.0 10/30/2014 2231   NITRITE NEGATIVE 06/21/2019 0002   LEUKOCYTESUR TRACE (A) 06/21/2019 0002   Sepsis Labs: (procalcitonin:4,lacticidven:4) ) Recent Results (from the past 240 hour(s))  SARS Coronavirus 2 [facility] order, Performed in [facility] hospital lab) Nasopharyngeal Nasopharyngeal Swab     Status: None   Collection Time: 06/21/19  2:26 AM   Specimen: Nasopharyngeal Swab  Result Value Ref Range Status   SARS Coronavirus 2 NEGATIVE NEGATIVE Final    Comment: (NOTE) If result is NEGATIVE SARS-CoV-2 target nucleic acids are NOT DETECTED. The SARS-CoV-2 RNA is generally detectable in  upper and lower  respiratory specimens during the acute phase of infection. The lowest  concentration of SARS-CoV-2 viral copies this assay can detect is 250  copies / mL. A negative result does not preclude SARS-CoV-2 infection  and should not be used as the sole basis for treatment or other  patient management decisions.  A negative result may occur with  improper specimen collection / handling, submission of specimen other  than nasopharyngeal swab, presence of viral mutation(s) within the  areas targeted by this assay, and inadequate number of viral copies  (<250 copies / mL). A negative result must be combined with clinical  observations, patient history, and epidemiological information. If result is POSITIVE SARS-CoV-2 target nucleic acids are DETECTED. The SARS-CoV-2 RNA is generally detectable in upper and lower  respiratory specimens dur ing the acute phase of infection.  Positive  results are indicative of active infection with SARS-CoV-2.  Clinical  correlation with patient history and other diagnostic  information is  necessary to determine patient infection status.  Positive results do  not rule out bacterial infection or co-infection with other viruses. If result is PRESUMPTIVE POSTIVE SARS-CoV-2 nucleic acids MAY BE PRESENT.   A presumptive positive result was obtained on the submitted specimen  and confirmed on repeat testing.  While 2019 novel coronavirus  (SARS-CoV-2) nucleic acids may be present in the submitted sample  additional confirmatory testing may be necessary for epidemiological  and / or clinical management purposes  to differentiate between  SARS-CoV-2 and other Sarbecovirus currently known to infect humans.  If clinically indicated additional testing with an alternate test  methodology [phone] is advised. The SARS-CoV-2 RNA is generally  detectable in upper and lower respiratory sp ecimens during the acute  phase of infection. The expected result is Negative. Fact Sheet for Patients:  BoilerBrush.com.cyhttps://www.fda.gov/media/136312/download Fact Sheet for Healthcare Providers: https://pope.com/https://www.fda.gov/media/136313/download This test is not yet approved or cleared by the [place] FDA and has been authorized for detection and/or diagnosis of SARS-CoV-2 by FDA under an Emergency Use Authorization (EUA).  This EUA will remain in effect (meaning this test can be used) for the duration of the COVID-19 declaration under Section 564(b)(1) of the Act, 21 U.S.C. section 360bbb-3(b)(1), unless the authorization is terminated or revoked sooner. Performed at [facility], [address] [place], [place] [address]      Radiological Exams on Admission: Ct Abdomen Pelvis W Contrast  Result Date: 06/21/2019 CLINICAL DATA:  Abdominal pain with fever EXAM: CT ABDOMEN AND PELVIS WITH CONTRAST TECHNIQUE: Multidetector CT imaging of the abdomen and pelvis was performed using the standard protocol following bolus administration of intravenous contrast. CONTRAST:  100mL OMNIPAQUE  IOHEXOL 300 MG/ML  SOLN COMPARISON:  None. FINDINGS: Lower chest: Lung bases demonstrate no acute consolidation or effusion. The heart size is normal Hepatobiliary: No focal hepatic abnormality. Mild periportal edema. Abnormal appearance of gallbladder with what appears to be severe wall thickening. No calcified stone. No biliary dilatation Pancreas: Unremarkable. No pancreatic ductal dilatation or surrounding inflammatory changes. Spleen: Upper normal in size at 13 cm. Adrenals/Urinary Tract: 5.6 cm low attenuation right adrenal mass with peripheral calcification. Small stone measuring 4 mm in the mid right kidney. The bladder is unremarkable Stomach/Bowel: Stomach is within normal limits. Appendix not well seen but no right lower quadrant inflammatory process. No evidence of bowel wall thickening, distention, or inflammatory changes. Vascular/Lymphatic: Nonaneurysmal aorta. No significantly enlarged lymph nodes Reproductive: Uterus and bilateral adnexa are unremarkable. Other: No free air.  Small free fluid in the pelvis Musculoskeletal: No acute or significant osseous findings. IMPRESSION: 1. Abnormal appearance of the gallbladder with what appears to be severe wall thickening, consider correlation with right upper quadrant abdominal ultrasound. 2. 5.6 cm low-attenuation lesion in the right adrenal gland either representing a cyst or adenoma. 3. 4 mm stone in the right kidney.  No hydronephrosis. Electronically Signed   By: Jasmine Pang M.D.   On: 06/21/2019 02:41   US Abdomen Limited Ruq  Result Date: 06/21/2019 CLINICAL DATA:  Abdominal pain and fever. Transaminitis. Abnormal gallbladder on recent CT. EXAM: ULTRASOUND ABDOMEN LIMITED RIGHT UPPER QUADRANT COMPARISON:  CT on 06/21/2019 FINDINGS: Gallbladder: Marked diffuse gallbladder wall thickening is seen measuring up to 17 mm. The gallbladder lumen is poorly distended which limits evaluation, however no large gallstones are seen. No evidence of  pericholecystic fluid. Common bile duct: Diameter: 3 mm, within normal limits. Liver: No focal lesion identified. Within normal limits in parenchymal echogenicity. Portal vein is patent on color Doppler imaging with normal direction of blood flow towards the liver. Other: None. IMPRESSION: Marked diffuse gallbladder wall thickening which limits evaluation for gallstones. No large gallstones or gallbladder distention seen. This finding is nonspecific, and can be secondary to many other etiologies including hepatitis and diffuse hepatocellular disease. Unremarkable sonographic appearance of hepatic parenchyma. No evidence of biliary ductal dilatation. Electronically Signed   By: Danae Orleans M.D.   On: 06/21/2019 09:16    EKG: Not performed.   Assessment/Plan   1. Acute hepatitis  - Presents with upper abd pain and N/V, found to have transaminases in 4k range, t bili 6.4, and INR 2.2  - HCV antibody and Hep A IgM are reactive in ED  - GI recommends checking Hep C genotype and quantitative antibody, and discussing with ID  - Check Hep C genotype and quantitative antibody, continue supportive care, discuss with ID in am    2. Hyponatremia  - Serum sodium is 131 in setting of hypovolemia  - She was given 2 liters NS in ED and continued on NS infusion  - Repeat chem panel in am    3. Substance abuse  - IV heroin abuse, wants to quit but thinks she'll need help  - Social work consulted     PPE: Mask, face shield  DVT prophylaxis: SCD's  Code Status: Full  Family Communication: Discussed with patient  Consults called: None  Admission status: Inpatient     Briscoe Deutscher, MD [facility] Pager [phone]  If 7PM-7AM, please contact night-coverage [email or web address] Password [facility]  06/21/2019, 10:34 PM

## 2019-06-21 NOTE — ED Provider Notes (Signed)
Emergency Department Provider Note   I have reviewed the triage vital signs and the nursing notes.   HISTORY  Chief Complaint Emesis   HPI Crystal Aguirre is a 43 y.o. female who presents to the emergency department today secondary to vomiting.  Patient states that she is on and off again IV heroin user she most recently stopped 3 days ago.  She is tried to stop multiple times in the past and knows what withdrawal symptoms are like and this is nothing like that.  She has abdominal pain that started prior to the vomiting.  She has had nonbloody nonbilious vomiting since that time.  States the pain seems to be in upper quadrants.  Patient is never been sick like this before.  States she had a GI bug in the past but not like this.  She has had a couple fevers of around 102 in the last 48 hours.  No other associated symptoms.   No other associated or modifying symptoms.    Past Medical History:  Diagnosis Date  . ADD (attention deficit disorder)   . Back pain   . Gastric ulcer   . Kidney cysts   . MRSA infection   . Opiate addiction ([place])     There are no active problems to display for this patient.   Past Surgical History:  Procedure Laterality Date  . HEMORRHOID SURGERY      Current Outpatient Rx  . Order #: [id number] Class: Historical Med  . Order #: [id number] Class: Historical Med  . Order #: [id number] Class: Historical Med  . Order #: [id number] Class: Print  . Order #: [id number] Class: Historical Med    Allergies Sulfa antibiotics  History reviewed. No pertinent family history.  Social History Social History   Tobacco Use  . Smoking status: Never Smoker  . Smokeless tobacco: Never Used  Substance Use Topics  . Alcohol use: Yes  . Drug use: No    Comment: heroin    Review of Systems  All other systems negative except as documented in the HPI. All pertinent positives and negatives as reviewed in the HPI. ____________________________________________    PHYSICAL EXAM:  VITAL SIGNS: ED Triage Vitals  Enc Vitals Group     BP 06/20/19 2335 118/78     Pulse Rate 06/20/19 2335 89     Resp 06/20/19 2335 16     Temp 06/20/19 2335 97.8 F (36.6 C)     Temp Source 06/20/19 2335 Oral     SpO2 06/20/19 2335 99 %     Weight 06/20/19 2333 120 lb (54.4 kg)     Height 06/20/19 2333 5' 3"  (1.6 m)     Head Circumference --      Peak Flow --      Pain Score 06/20/19 2333 7     Pain Loc --      Pain Edu? --      Excl. in [place]? --     Constitutional: Alert and oriented. Well appearing and in no acute distress. Eyes: Conjunctivae are normal. PERRL. EOMI. Head: Atraumatic. Nose: No congestion/rhinnorhea. Mouth/Throat: Mucous membranes are moist.  Oropharynx non-erythematous. Neck: No stridor.  No meningeal signs.   Cardiovascular: Normal rate, regular rhythm. Good peripheral circulation. Grossly normal heart sounds.   Respiratory: Normal respiratory effort.  No retractions. Lungs CTAB. Gastrointestinal: Soft and ttp in RUQ/LUQ with involuntary guarding.  Musculoskeletal: No lower extremity tenderness nor edema. No gross deformities of extremities. Neurologic:  Normal speech and language. No gross focal neurologic  deficits are appreciated.  Skin:  Skin is warm, dry and intact. No rash noted.   ____________________________________________   LABS (all labs ordered are listed, but only abnormal results are displayed)  Labs Reviewed  CBC WITH DIFFERENTIAL/PLATELET - Abnormal; Notable for the following components:      Result Value   WBC 3.8 (*)    RBC 5.15 (*)    All other components within normal limits  COMPREHENSIVE METABOLIC PANEL - Abnormal; Notable for the following components:   Sodium 131 (*)    Chloride 95 (*)    Glucose, Bld 119 (*)    Calcium 8.7 (*)    Albumin 3.3 (*)    AST 4,249 (*)    ALT 4,634 (*)    Alkaline Phosphatase 255 (*)    Total Bilirubin 6.4 (*)    All other components within normal limits  URINALYSIS, ROUTINE  W REFLEX MICROSCOPIC - Abnormal; Notable for the following components:   Color, Urine BROWN (*)    APPearance CLOUDY (*)    Specific Gravity, Urine >1.030 (*)    Glucose, UA 100 (*)    Bilirubin Urine LARGE (*)    Leukocytes,Ua TRACE (*)    All other components within normal limits  LIPASE, BLOOD - Abnormal; Notable for the following components:   Lipase 96 (*)    All other components within normal limits  URINALYSIS, MICROSCOPIC (REFLEX) - Abnormal; Notable for the following components:   Bacteria, UA FEW (*)    All other components within normal limits  PROTIME-INR - Abnormal; Notable for the following components:   Prothrombin Time 24.5 (*)    INR 2.2 (*)    All other components within normal limits  URINE CULTURE  SARS CORONAVIRUS 2 (HOSPITAL ORDER, [place] [facility])  LACTIC ACID, PLASMA  LACTIC ACID, PLASMA  ACETAMINOPHEN LEVEL  HEPATITIS PANEL, ACUTE   ____________________________________________  EKG   EKG Interpretation  Date/Time:    Ventricular Rate:    PR Interval:    QRS Duration:   QT Interval:    QTC Calculation:   R Axis:     Text Interpretation:         ____________________________________________  RADIOLOGY  Ct Abdomen Pelvis W Contrast  Result Date: 06/21/2019 CLINICAL DATA:  Abdominal pain with fever EXAM: CT ABDOMEN AND PELVIS WITH CONTRAST TECHNIQUE: Multidetector CT imaging of the abdomen and pelvis was performed using the standard protocol following bolus administration of intravenous contrast. CONTRAST:  127m OMNIPAQUE IOHEXOL 300 MG/ML  SOLN COMPARISON:  None. FINDINGS: Lower chest: Lung bases demonstrate no acute consolidation or effusion. The heart size is normal Hepatobiliary: No focal hepatic abnormality. Mild periportal edema. Abnormal appearance of gallbladder with what appears to be severe wall thickening. No calcified stone. No biliary dilatation Pancreas: Unremarkable. No pancreatic ductal dilatation or  surrounding inflammatory changes. Spleen: Upper normal in size at 13 cm. Adrenals/Urinary Tract: 5.6 cm low attenuation right adrenal mass with peripheral calcification. Small stone measuring 4 mm in the mid right kidney. The bladder is unremarkable Stomach/Bowel: Stomach is within normal limits. Appendix not well seen but no right lower quadrant inflammatory process. No evidence of bowel wall thickening, distention, or inflammatory changes. Vascular/Lymphatic: Nonaneurysmal aorta. No significantly enlarged lymph nodes Reproductive: Uterus and bilateral adnexa are unremarkable. Other: No free air.  Small free fluid in the pelvis Musculoskeletal: No acute or significant osseous findings. IMPRESSION: 1. Abnormal appearance of the gallbladder with what appears to be severe wall thickening, consider correlation with  right upper quadrant abdominal ultrasound. 2. 5.6 cm low-attenuation lesion in the right adrenal gland either representing a cyst or adenoma. 3. 4 mm stone in the right kidney.  No hydronephrosis. Electronically Signed   By: Donavan Foil M.D.   On: 06/21/2019 02:41    ____________________________________________   PROCEDURES  Procedure(s) performed:   Procedures   ____________________________________________   INITIAL IMPRESSION / ASSESSMENT AND PLAN / ED COURSE  Patient with persistent worsening symptoms while emergency room so CT scan done after labs showed a significant elevated liver enzymes, bilirubin and alk phos.  Antibiotics started possible cholecystitis pending ultrasound.  More likely to be acute hepatitis causing the gallbladder wall thickening.  Multiple doses of pain medicine given.  Nausea seems to have improved.  Plan for admission for pain control and further work-up of her symptoms. covid ordered pending possible procedure during hospital admission.    Pertinent labs & imaging results that were available during my care of the patient were reviewed by me and considered  in my medical decision making (see chart for details).  ____________________________________________  FINAL CLINICAL IMPRESSION(S) / ED DIAGNOSES  Final diagnoses:  Acute hepatitis     MEDICATIONS GIVEN DURING THIS VISIT:  Medications  piperacillin-tazobactam (ZOSYN) IVPB 3.375 g (3.375 g Intravenous New Bag/Given 06/21/19 0319)  lactated ringers bolus 1,000 mL ( Intravenous Stopped 06/21/19 0120)  ondansetron (ZOFRAN) injection 4 mg (4 mg Intravenous Given 06/21/19 0010)  HYDROmorphone (DILAUDID) injection 0.5 mg (0.5 mg Intravenous Given 06/21/19 0140)  lactated ringers bolus 1,000 mL (0 mLs Intravenous Stopped 06/21/19 0249)  metoCLOPramide (REGLAN) injection 5 mg (5 mg Intravenous Given 06/21/19 0135)  iohexol (OMNIPAQUE) 300 MG/ML solution 100 mL (100 mLs Intravenous Contrast Given 06/21/19 0229)     NEW OUTPATIENT MEDICATIONS STARTED DURING THIS VISIT:  New Prescriptions   No medications on file    Note:  This note was prepared with assistance of Dragon voice recognition software. Occasional wrong-word or sound-a-like substitutions may have occurred due to the inherent limitations of voice recognition software.   Karsynn Deweese, Corene Cornea, MD 06/21/19 [phone]

## 2019-06-21 NOTE — ED Notes (Signed)
Report to [facility].  

## 2019-06-22 DIAGNOSIS — B159 Hepatitis A without hepatic coma: Secondary | ICD-10-CM

## 2019-06-22 DIAGNOSIS — Z91040 Latex allergy status: Secondary | ICD-10-CM

## 2019-06-22 DIAGNOSIS — Z881 Allergy status to other antibiotic agents status: Secondary | ICD-10-CM

## 2019-06-22 DIAGNOSIS — R768 Other specified abnormal immunological findings in serum: Secondary | ICD-10-CM | POA: Diagnosis present

## 2019-06-22 DIAGNOSIS — R7401 Elevation of levels of liver transaminase levels: Secondary | ICD-10-CM

## 2019-06-22 DIAGNOSIS — F1990 Other psychoactive substance use, unspecified, uncomplicated: Secondary | ICD-10-CM

## 2019-06-22 DIAGNOSIS — Z91018 Allergy to other foods: Secondary | ICD-10-CM

## 2019-06-22 DIAGNOSIS — E871 Hypo-osmolality and hyponatremia: Secondary | ICD-10-CM

## 2019-06-22 DIAGNOSIS — B1710 Acute hepatitis C without hepatic coma: Principal | ICD-10-CM

## 2019-06-22 DIAGNOSIS — Z880 Allergy status to penicillin: Secondary | ICD-10-CM

## 2019-06-22 LAB — HEPATIC FUNCTION PANEL
ALT: 2472 U/L — ABNORMAL HIGH (ref 0–44)
AST: 1453 U/L — ABNORMAL HIGH (ref 15–41)
Albumin: 2.5 g/dL — ABNORMAL LOW (ref 3.5–5.0)
Alkaline Phosphatase: 185 U/L — ABNORMAL HIGH (ref 38–126)
Bilirubin, Direct: 3.4 mg/dL — ABNORMAL HIGH (ref 0.0–0.2)
Indirect Bilirubin: 1.5 mg/dL — ABNORMAL HIGH (ref 0.3–0.9)
Total Bilirubin: 4.9 mg/dL — ABNORMAL HIGH (ref 0.3–1.2)
Total Protein: 5.7 g/dL — ABNORMAL LOW (ref 6.5–8.1)

## 2019-06-22 LAB — HEPATITIS PANEL, ACUTE
HCV Ab: REACTIVE — AB
Hep A IgM: REACTIVE — AB
Hep B C IgM: NONREACTIVE
Hepatitis B Surface Ag: NONREACTIVE

## 2019-06-22 LAB — BASIC METABOLIC PANEL
Anion gap: 8 (ref 5–15)
BUN: 7 mg/dL (ref 6–20)
CO2: 24 mmol/L (ref 22–32)
Calcium: 8 mg/dL — ABNORMAL LOW (ref 8.9–10.3)
Chloride: 100 mmol/L (ref 98–111)
Creatinine, Ser: 0.62 mg/dL (ref 0.44–1.00)
GFR calc Af Amer: >60 mL/min (ref >60)
GFR calc non Af Amer: >60 mL/min (ref >60)
Glucose, Bld: 133 mg/dL — ABNORMAL HIGH (ref 70–99)
Potassium: 3.6 mmol/L (ref 3.5–5.1)
Sodium: 132 mmol/L — ABNORMAL LOW (ref 135–145)

## 2019-06-22 LAB — URINE CULTURE: Culture: 40000 — AB

## 2019-06-22 LAB — CBC
HCT: 37.3 % (ref 36.0–46.0)
Hemoglobin: 12.2 g/dL (ref 12.0–15.0)
MCH: 28.6 pg (ref 26.0–34.0)
MCHC: 32.7 g/dL (ref 30.0–36.0)
MCV: 87.4 fL (ref 80.0–100.0)
Platelets: 300 10*3/uL (ref 150–400)
RBC: 4.27 MIL/uL (ref 3.87–5.11)
RDW: 14.6 % (ref 11.5–15.5)
WBC: 3.8 10*3/uL — ABNORMAL LOW (ref 4.0–10.5)
nRBC: 0 % (ref 0.0–0.2)

## 2019-06-22 LAB — MRSA PCR SCREENING: MRSA by PCR: NEGATIVE

## 2019-06-22 LAB — HIV ANTIBODY (ROUTINE TESTING W REFLEX): HIV Screen 4th Generation wRfx: NONREACTIVE

## 2019-06-22 LAB — PROTIME-INR
INR: 2.1 — ABNORMAL HIGH (ref 0.8–1.2)
Prothrombin Time: 23.5 seconds — ABNORMAL HIGH (ref 11.4–15.2)

## 2019-06-22 MED ORDER — TRAZODONE HCL 50 MG PO TABS
50.0000 mg | ORAL_TABLET | Freq: Every evening | ORAL | Status: DC | PRN
  Administered 2019-06-22 – 2019-06-23 (×3): 50 mg via ORAL
  Filled 2019-06-22 (×3): qty 1

## 2019-06-22 MED ORDER — INFLUENZA VAC SPLIT QUAD 0.5 ML IM SUSY: 0.5000 mL | PREFILLED_SYRINGE | INTRAMUSCULAR | Status: DC

## 2019-06-22 NOTE — Plan of Care (Signed)

## 2019-06-22 NOTE — Progress Notes (Signed)
Received report from Ander Purpura, [place]. Agree with previous assessment and will continue to monitor.   Call bell within reach and unit policies/procedures reviewed with patient.

## 2019-06-22 NOTE — Progress Notes (Signed)
PROGRESS NOTE    Crystal Aguirre  ZOX:[id number] DOB: 1975-09-16 DOA: 06/20/2019 PCP: Vivien Prestoorrington, Kip A, MD    Brief Narrative:   Crystal Aguirre is a 43 y.o. female with medical history significant for attention deficit disorder, history of heroin abuse, now presenting to the emergency department for evaluation of upper abdominal pain and nausea with nonbloody vomiting.  Patient reports few days of upper abdominal pain with nausea and nonbloody vomiting.  She has been struggling with drug abuse, has quit multiple times, last used 3 days ago, wants to achieve long-term abstinence.  She developed pain in the upper abdomen, more so on the right than the left, and has had nausea with nonbloody vomiting for the past 2 to 3 days.  She reports subjective fevers.  She denies any cough or diarrhea.  Upon arrival to the ED, patient is found to be afebrile, saturating adequately on room air, and with systolic blood pressure of 84.  Chemistry panel is concerning for hyponatremia, total bilirubin 6.4, and transaminases in the 4000 range.  CBC is unremarkable.  Lactic acid is normal.  INR elevated to 2.2. CT of the abdomen and pelvis with abnormal gallbladder appearance and right adrenal lesion.  Right upper quadrant ultrasound with marked diffuse gallbladder wall thickening, nonspecific.  COVID-19 testing negative.  Gastroenterology was consulted by the ED physician and recommended checking quantitative hepatitis C virus and genotype as well as discussing with ID.  Patient was given 2 L of lactated Ringer's, Reglan, and Zofran in the ED.   Assessment & Plan:   Principal Problem:   Acute hepatitis Active Problems:   Coagulopathy (HCC)   Substance abuse (HCC)   Hyponatremia   Transaminitis   Hepatitis A antibody positive   Hepatitis C antibody test positive   Acute hepatitis  Hepatitis A IgM positive Hepatitis C antibody positive Patient presenting to the ED with complaints of upper abdominal pain  associated with nausea and vomiting over the previous 3-4 days.  She was found to have elevated transaminases in the 4k range, t bili 6.4, and INR 2.2.  HCV antibody and Hep A IgM are reactive.  --[facility] and infectious disease following, appreciate assistance --Hepatitis C genotype: Pending --Hepatitis C quantitative antibody: pending --LFTs trending down, continue daily CMP --Supportive care   Hyponatremia  Serum sodium is 131 in setting of hypovolemia. She was given 2 liters NS in ED and continued on NS infusion  --Repeat electrolytes in a.m.  Substance abuse  Patient reports continued IV heroin abuse.  Counseled on need for total cessation given her now findings of hepatitis C positive.  She appears that she wants assistance with quitting. --Social work consulted     DVT prophylaxis: SCDs/ambulation Code Status: Full code Family Communication: None Disposition Plan: Continue inpatient, Transfer from SDU to floor, awaiting specialists recommendations, IV fluid hydration, continue to follow CMP daily, likely discharge home when medically ready   Consultants:   [facility]: [place]  Infectious Disease: [place]  Procedures:  none  Antimicrobials:  none   Subjective: Patient seen and examined at bedside, resting comfortably.  States abdominal discomfort improving.  Nausea/vomiting has resolved.  Able to tolerate diet.  Discussed with patient extensively at bedside regarding positive hepatitis A/C results.  Also discussed need for complete IV drug use cessation as this will have serious effects long-term in regards to her global health.  She seems to be willing to quit, will likely need assistance.  No other complaints or concerns at this time.  Denies headache, no fever/chills/night sweats, no nausea/vomiting/diarrhea, no chest pain, palpitations, no issues with bowel/bladder function, no cough/congestion, no paresthesias.  No acute events overnight per nursing  staff.  Objective: Vitals:   06/22/19 0800 06/22/19 0833 06/22/19 0900 06/22/19 1000  BP: 97/66  103/68 101/78  Pulse: 79  80 84  Resp: Temp:  98.3 F (36.8 C)    TempSrc:  Oral    SpO2: 99%  100% 98%  Weight:      Height:        Intake/Output Summary (Last 24 hours) at 06/22/2019 1210 Last data filed at 06/22/2019 0500 Gross per 24 hour  Intake 619.73 ml  Output 950 ml  Net -330.27 ml   Filed Weights   06/20/19 2333 06/21/19 0830  Weight: 54.4 kg 54.4 kg    Examination:  General exam: Appears calm and comfortable  Respiratory system: Clear to auscultation. Respiratory effort normal. Cardiovascular system: S1 & S2 heard, RRR. No JVD, murmurs, rubs, gallops or clicks. No pedal edema. Gastrointestinal system: Abdomen is nondistended, soft and nontender. No organomegaly or masses felt. Normal bowel sounds heard. Central nervous system: Alert and oriented. No focal neurological deficits. Extremities: Symmetric 5 x 5 power. Skin: IV track marks noted anterior left forearm, no surrounding erythema/fluctuance, there is a slightly hard nodule which is likely sclerosed vessel. Psychiatry: Judgement and insight appear normal. Mood & affect appropriate.     Data Reviewed: I have personally reviewed following labs and imaging studies  CBC: Recent Labs  Lab 06/21/19 0001 06/21/19 1220 06/22/19 0224  WBC 3.8* 4.6 3.8*  NEUTROABS 2.2 2.8  --   HGB 14.5 14.0 12.2  HCT 44.4 42.5 37.3  MCV 86.2 85.2 87.4  PLT 291 306 300   Basic Metabolic Panel: Recent Labs  Lab 06/21/19 0001 06/21/19 1220 06/22/19 0224  NA 131* 131* 132*  K 3.8 4.1 3.6  CL 95* 96* 100  CO2 GLUCOSE 119* 67* 133*  BUN CREATININE 0.58 0.53 0.62  CALCIUM 8.7* 8.4* 8.0*   GFR: Estimated Creatinine Clearance: 75.8 mL/min (by C-G formula based on SCr of 0.62 mg/dL). Liver Function Tests: Recent Labs  Lab 06/21/19 0001 06/21/19 1220 06/22/19 0224  AST 4,249* 3,050*  1,453*  ALT 4,634* 3,726* 2,472*  ALKPHOS 255* 216* 185*  BILITOT 6.4* 6.1* 4.9*  PROT 8.0 6.9 5.7*  ALBUMIN 3.3* 2.9* 2.5*   Recent Labs  Lab 06/21/19 0001 06/21/19 1220  LIPASE 96* 113*   Recent Labs  Lab 06/21/19 1220  AMMONIA 23   Coagulation Profile: Recent Labs  Lab 06/21/19 0226 06/21/19 1220 06/22/19 0224  INR 2.2* 2.1* 2.1*   Cardiac Enzymes: No results for input(s): CKTOTAL, CKMB, CKMBINDEX, TROPONINI in the last 168 hours. BNP (last 3 results) No results for input(s): PROBNP in the last 8760 hours. HbA1C: No results for input(s): HGBA1C in the last 72 hours. CBG: No results for input(s): GLUCAP in the last 168 hours. Lipid Profile: No results for input(s): CHOL, HDL, LDLCALC, TRIG, CHOLHDL, LDLDIRECT in the last 72 hours. Thyroid Function Tests: No results for input(s): TSH, T4TOTAL, FREET4, T3FREE, THYROIDAB in the last 72 hours. Anemia Panel: No results for input(s): VITAMINB12, FOLATE, FERRITIN, TIBC, IRON, RETICCTPCT in the last 72 hours. Sepsis Labs: Recent Labs  Lab 06/21/19 0226  LATICACIDVEN 1.5    Recent Results (from the past 240 hour(s))  Urine culture     Status: None (Preliminary result)  Collection Time: 06/21/19 12:03 AM   Specimen: Urine, Random  Result Value Ref Range Status   Specimen Description   Final    URINE, RANDOM Performed at [facility], [address] [place], [place] [address]    Special Requests   Final    NONE Performed at [facility], [address] [place], [place] [address]    Culture   Final    CULTURE REINCUBATED FOR BETTER GROWTH Performed at [facility], [address], [place], [place] [address]    Report Status PENDING  Incomplete  SARS Coronavirus 2 [facility] order, Performed in [facility] hospital lab) Nasopharyngeal Nasopharyngeal Swab     Status: None   Collection Time: 06/21/19  2:26 AM   Specimen: Nasopharyngeal Swab  Result Value Ref Range Status   SARS  Coronavirus 2 NEGATIVE NEGATIVE Final    Comment: (NOTE) If result is NEGATIVE SARS-CoV-2 target nucleic acids are NOT DETECTED. The SARS-CoV-2 RNA is generally detectable in upper and lower  respiratory specimens during the acute phase of infection. The lowest  concentration of SARS-CoV-2 viral copies this assay can detect is 250  copies / mL. A negative result does not preclude SARS-CoV-2 infection  and should not be used as the sole basis for treatment or other  patient management decisions.  A negative result may occur with  improper specimen collection / handling, submission of specimen other  than nasopharyngeal swab, presence of viral mutation(s) within the  areas targeted by this assay, and inadequate number of viral copies  (<250 copies / mL). A negative result must be combined with clinical  observations, patient history, and epidemiological information. If result is POSITIVE SARS-CoV-2 target nucleic acids are DETECTED. The SARS-CoV-2 RNA is generally detectable in upper and lower  respiratory specimens dur ing the acute phase of infection.  Positive  results are indicative of active infection with SARS-CoV-2.  Clinical  correlation with patient history and other diagnostic information is  necessary to determine patient infection status.  Positive results do  not rule out bacterial infection or co-infection with other viruses. If result is PRESUMPTIVE POSTIVE SARS-CoV-2 nucleic acids MAY BE PRESENT.   A presumptive positive result was obtained on the submitted specimen  and confirmed on repeat testing.  While 2019 novel coronavirus  (SARS-CoV-2) nucleic acids may be present in the submitted sample  additional confirmatory testing may be necessary for epidemiological  and / or clinical management purposes  to differentiate between  SARS-CoV-2 and other Sarbecovirus currently known to infect humans.  If clinically indicated additional testing with an alternate test   methodology [phone]) is advised. The SARS-CoV-2 RNA is generally  detectable in upper and lower respiratory sp ecimens during the acute  phase of infection. The expected result is Negative. Fact Sheet for Patients:  BoilerBrush.com.cy Fact Sheet for Healthcare Providers: https://pope.com/ This test is not yet approved or cleared by the [place] FDA and has been authorized for detection and/or diagnosis of SARS-CoV-2 by FDA under an Emergency Use Authorization (EUA).  This EUA will remain in effect (meaning this test can be used) for the duration of the COVID-19 declaration under Section 564(b)(1) of the Act, 21 U.S.C. section 360bbb-3(b)(1), unless the authorization is terminated or revoked sooner. Performed at [facility], [address] [place], [place] [address]   MRSA PCR Screening     Status: None   Collection Time: 06/21/19 10:24 PM   Specimen: Nasopharyngeal  Result Value Ref Range Status   MRSA by PCR NEGATIVE NEGATIVE Final    Comment:        The GeneXpert MRSA Assay (FDA approved for NASAL specimens only), is one component of a comprehensive MRSA colonization surveillance program. It is not intended to diagnose MRSA infection nor to guide or monitor treatment for MRSA infections. Performed at [facility], [place] [address] [place], [place] [address]          Radiology Studies: Ct Abdomen Pelvis W Contrast  Result Date: 06/21/2019 CLINICAL DATA:  Abdominal pain with fever EXAM: CT ABDOMEN AND PELVIS WITH CONTRAST TECHNIQUE: Multidetector CT imaging of the abdomen and pelvis was performed using the standard protocol following bolus administration of intravenous contrast. CONTRAST:  144mL OMNIPAQUE IOHEXOL 300 MG/ML  SOLN COMPARISON:  None. FINDINGS: Lower chest: Lung bases demonstrate no acute consolidation or effusion. The heart size is normal Hepatobiliary: No focal hepatic  abnormality. Mild periportal edema. Abnormal appearance of gallbladder with what appears to be severe wall thickening. No calcified stone. No biliary dilatation Pancreas: Unremarkable. No pancreatic ductal dilatation or surrounding inflammatory changes. Spleen: Upper normal in size at 13 cm. Adrenals/Urinary Tract: 5.6 cm low attenuation right adrenal mass with peripheral calcification. Small stone measuring 4 mm in the mid right kidney. The bladder is unremarkable Stomach/Bowel: Stomach is within normal limits. Appendix not well seen but no right lower quadrant inflammatory process. No evidence of bowel wall thickening, distention, or inflammatory changes. Vascular/Lymphatic: Nonaneurysmal aorta. No significantly enlarged lymph nodes Reproductive: Uterus and bilateral adnexa are unremarkable. Other: No free air.  Small free fluid in the pelvis Musculoskeletal: No acute or significant osseous findings. IMPRESSION: 1. Abnormal appearance of the gallbladder with what appears to be severe wall thickening, consider correlation with right upper quadrant abdominal ultrasound. 2. 5.6 cm low-attenuation lesion in the right adrenal gland either representing a cyst or adenoma. 3. 4 mm stone in the right kidney.  No hydronephrosis. Electronically Signed   By: Donavan Foil M.D.   On: 06/21/2019 02:41   US Abdomen Limited Ruq  Result Date: 06/21/2019 CLINICAL DATA:  Abdominal pain and fever. Transaminitis. Abnormal gallbladder on recent CT. EXAM: ULTRASOUND ABDOMEN LIMITED RIGHT UPPER QUADRANT COMPARISON:  CT on 06/21/2019 FINDINGS: Gallbladder: Marked diffuse gallbladder wall thickening is seen measuring up to 17 mm. The gallbladder lumen is poorly distended which limits evaluation, however no large gallstones are seen. No evidence of pericholecystic fluid. Common bile duct: Diameter: 3 mm, within normal limits. Liver: No focal lesion identified. Within normal limits in parenchymal echogenicity. Portal vein is patent on  color Doppler imaging with normal direction of blood flow towards the liver. Other: None. IMPRESSION: Marked diffuse gallbladder wall thickening which limits evaluation for gallstones. No large gallstones or gallbladder distention seen. This finding is nonspecific, and can be secondary to many other etiologies including hepatitis and diffuse hepatocellular disease. Unremarkable sonographic appearance of hepatic parenchyma. No evidence of biliary ductal dilatation. Electronically Signed   By: Marlaine Hind M.D.   On: 06/21/2019 09:16        Scheduled Meds:  influenza vac split quadrivalent PF  0.5 mL Intramuscular Tomorrow-1000   Continuous Infusions:   LOS: 1 day    Time spent: 36 minutes spent on chart review, discussion with nursing staff, consultants, personally reviewing all imaging studies and labs updating family and interview/physical exam; more than 50% of that time was spent in counseling and/or coordination of care.    Jolyn Deshmukh J [place]  DO [facility] Pager [phone]  If 7PM-7AM, please contact night-coverage [email or web address] Password [email or web address] 06/22/2019, 12:10 PM

## 2019-06-22 NOTE — Consult Note (Signed)
[facility]       Reason for Consult: acute hepatitis    Referring Physician: Dr. [place]  Principal Problem:   Acute hepatitis Active Problems:   Coagulopathy (HCC)   Substance abuse (HCC)   Hyponatremia   Transaminitis   Hepatitis A antibody positive   Hepatitis C antibody test positive    influenza vac split quadrivalent PF  0.5 mL Intramuscular Tomorrow-1000    Recommendations: Continue supportive care Will follow up with her as an outpatient to consider hepatitis C treatment   Assessment: She has acute hepatitis and is both hepatitis A IgM positive and hepatitis C Ab positive.  No known particular food borne contacts.  Her last hepatitis C Ab test was negative in the Spring of 2019, and no positive test since, so certainly this could be acute hepatitis C.  The recommendation is to consider treatment after 6-12 months if the hepatitis C RNA remains positive, though 20% of people spontaneously clear.  No indication or recommendation for acute treatment.  Her LFTs are otherwise trending down I will follow up with her as an outpatient next month, appointment placed.  Leukopenia - previously wnl in August.  Likely secondary to acute illness.    Antibiotics: none  HPI: Crystal Aguirre is a 43 y.o. female with IVDU, last used 3 days prior to admission, came in with n/v and with an AST of 4,249 and ALT of 4,634 with Ab tests as above.  She feels better since admission.  LFTs tending down.  Some hyponatremia.  No known food contacts.  No associated rash.  HIV negative.    Review of Systems:  Constitutional: negative for fevers, chills, anorexia and weight loss Respiratory: negative for cough or sputum Cardiovascular: negative for dyspnea Integument/breast: negative for rash Musculoskeletal: negative for myalgias and arthralgias All other systems reviewed and are negative    Past Medical History:  Diagnosis Date   ADD (attention deficit  disorder)    Back pain    Gastric ulcer    Kidney cysts    MRSA infection    Opiate addiction (HCC)     Social History   Tobacco Use   Smoking status: Never Smoker   Smokeless tobacco: Never Used  Substance Use Topics   Alcohol use: Yes   Drug use: No    Comment: heroin    History reviewed. No pertinent family history.  Allergies  Allergen Reactions   Latex Rash and Swelling   Penicillin G Hives   Soy Allergy Hives   Sulfa Antibiotics Other (See Comments) and Hives    Joint pain Other reaction(s): Unknown Joint pain    Physical Exam: Constitutional: in no apparent distress  Vitals:   06/22/19 1000 06/22/19 1339  BP: 101/78 118/77  Pulse: 84 71  Resp: 12 16  Temp:  98.1 F (36.7 C)  SpO2: 98% 99%   EYES: anicteric ENMT: no thrush Cardiovascular: Cor RRR Respiratory: CTA B; normal respiratory effort GI: soft Musculoskeletal: no edema Skin: negatives: no rash Neuro: non-focal  Lab Results  Component Value Date   WBC 3.8 (L) 06/22/2019   HGB 12.2 06/22/2019   HCT 37.3 06/22/2019   MCV 87.4 06/22/2019   PLT 300 06/22/2019    Lab Results  Component Value Date   CREATININE 0.62 06/22/2019   BUN 7 06/22/2019   NA 132 (L) 06/22/2019   K 3.6 06/22/2019   CL 100 06/22/2019   CO2 24 06/22/2019    Lab Results  Component Value Date   ALT 2,472 (H) 06/22/2019   AST 1,453 (H) 06/22/2019   ALKPHOS 185 (H) 06/22/2019     Microbiology: Recent Results (from the past 240 hour(s))  Urine culture     Status: Abnormal   Collection Time: 06/21/19 12:03 AM   Specimen: Urine, Random  Result Value Ref Range Status   Specimen Description   Final    URINE, RANDOM Performed at [facility], [address] [place], [place] [address]    Special Requests   Final    NONE Performed at [facility], [address] [place], [place] [address]    Culture (A)  Final    40,000 COLONIES/mL LACTOBACILLUS SPECIES Standardized  susceptibility testing for this organism is not available. Performed at [facility], [address], [place], [place] [address]    Report Status 06/22/2019 FINAL  Final  SARS Coronavirus 2 [facility] order, Performed in [facility] hospital lab) Nasopharyngeal Nasopharyngeal Swab     Status: None   Collection Time: 06/21/19  2:26 AM   Specimen: Nasopharyngeal Swab  Result Value Ref Range Status   SARS Coronavirus 2 NEGATIVE NEGATIVE Final    Comment: (NOTE) If result is NEGATIVE SARS-CoV-2 target nucleic acids are NOT DETECTED. The SARS-CoV-2 RNA is generally detectable in upper and lower  respiratory specimens during the acute phase of infection. The lowest  concentration of SARS-CoV-2 viral copies this assay can detect is 250  copies / mL. A negative result does not preclude SARS-CoV-2 infection  and should not be used as the sole basis for treatment or other  patient management decisions.  A negative result may occur with  improper specimen collection / handling, submission of specimen other  than nasopharyngeal swab, presence of viral mutation(s) within the  areas targeted by this assay, and inadequate number of viral copies  (<250 copies / mL). A negative result must be combined with clinical  observations, patient history, and epidemiological information. If result is POSITIVE SARS-CoV-2 target nucleic acids are DETECTED. The SARS-CoV-2 RNA is generally detectable in upper and lower  respiratory specimens dur ing the acute phase of infection.  Positive  results are indicative of active infection with SARS-CoV-2.  Clinical  correlation with patient history and other diagnostic information is  necessary to determine patient infection status.  Positive results do  not rule out bacterial infection or co-infection with other viruses. If result is PRESUMPTIVE POSTIVE SARS-CoV-2 nucleic acids MAY BE PRESENT.   A presumptive positive result was obtained on the submitted specimen   and confirmed on repeat testing.  While 2019 novel coronavirus  (SARS-CoV-2) nucleic acids may be present in the submitted sample  additional confirmatory testing may be necessary for epidemiological  and / or clinical management purposes  to differentiate between  SARS-CoV-2 and other Sarbecovirus currently known to infect humans.  If clinically indicated additional testing with an alternate test  methodology [phone]) is advised. The SARS-CoV-2 RNA is generally  detectable in upper and lower respiratory sp ecimens during the acute  phase of infection. The expected result is Negative. Fact Sheet for Patients:  BoilerBrush.com.cy Fact Sheet for Healthcare Providers: https://pope.com/ This test is not yet approved or cleared by the [place] FDA and has been authorized for detection and/or diagnosis of SARS-CoV-2 by FDA under an Emergency Use Authorization (EUA).  This EUA will remain in effect (meaning this test can be used) for the duration of the COVID-19 declaration under  Section 564(b)(1) of the Act, 21 U.S.C. section 360bbb-3(b)(1), unless the authorization is terminated or revoked sooner. Performed at [facility], [address] [place], [place] [address]   MRSA PCR Screening     Status: None   Collection Time: 06/21/19 10:24 PM   Specimen: Nasopharyngeal  Result Value Ref Range Status   MRSA by PCR NEGATIVE NEGATIVE Final    Comment:        The GeneXpert MRSA Assay (FDA approved for NASAL specimens only), is one component of a comprehensive MRSA colonization surveillance program. It is not intended to diagnose MRSA infection nor to guide or monitor treatment for MRSA infections. Performed at [facility], [place] [address], [place], [place] [address]     Briana Farner W Jake Goodson, MD [facility] [email or web address] 06/22/2019, 2:14 PM

## 2019-06-23 LAB — COMPREHENSIVE METABOLIC PANEL
ALT: 1552 U/L — ABNORMAL HIGH (ref 0–44)
AST: 515 U/L — ABNORMAL HIGH (ref 15–41)
Albumin: 2.4 g/dL — ABNORMAL LOW (ref 3.5–5.0)
Alkaline Phosphatase: 189 U/L — ABNORMAL HIGH (ref 38–126)
Anion gap: 8 (ref 5–15)
BUN: <5 mg/dL — ABNORMAL LOW (ref 6–20)
CO2: 26 mmol/L (ref 22–32)
Calcium: 8 mg/dL — ABNORMAL LOW (ref 8.9–10.3)
Chloride: 104 mmol/L (ref 98–111)
Creatinine, Ser: 0.53 mg/dL (ref 0.44–1.00)
GFR calc Af Amer: >60 mL/min (ref >60)
GFR calc non Af Amer: >60 mL/min (ref >60)
Glucose, Bld: 98 mg/dL (ref 70–99)
Potassium: 3.3 mmol/L — ABNORMAL LOW (ref 3.5–5.1)
Sodium: 138 mmol/L (ref 135–145)
Total Bilirubin: 4.8 mg/dL — ABNORMAL HIGH (ref 0.3–1.2)
Total Protein: 5.8 g/dL — ABNORMAL LOW (ref 6.5–8.1)

## 2019-06-23 LAB — HCV RNA QUANT: HCV Quantitative: NOT DETECTED IU/mL (ref >50)

## 2019-06-23 MED ORDER — GABAPENTIN 300 MG PO CAPS
300.0000 mg | ORAL_CAPSULE | Freq: Once | ORAL | Status: AC
  Administered 2019-06-23: 300 mg via ORAL
  Filled 2019-06-23: qty 1

## 2019-06-23 MED ORDER — SENNOSIDES-DOCUSATE SODIUM 8.6-50 MG PO TABS
2.0000 | ORAL_TABLET | Freq: Two times a day (BID) | ORAL | Status: DC
  Administered 2019-06-23 (×2): 2 via ORAL
  Filled 2019-06-23 (×2): qty 2

## 2019-06-23 MED ORDER — LACTATED RINGERS IV SOLN
INTRAVENOUS | Status: AC
  Administered 2019-06-23: 15:00:00 via INTRAVENOUS

## 2019-06-23 MED ORDER — POTASSIUM CHLORIDE CRYS ER 20 MEQ PO TBCR
40.0000 meq | EXTENDED_RELEASE_TABLET | Freq: Once | ORAL | Status: AC
  Administered 2019-06-23: 15:00:00 40 meq via ORAL
  Filled 2019-06-23: qty 2

## 2019-06-23 MED ORDER — DIPHENHYDRAMINE HCL 25 MG PO CAPS
25.0000 mg | ORAL_CAPSULE | Freq: Once | ORAL | Status: AC
  Administered 2019-06-23: 25 mg via ORAL
  Filled 2019-06-23: qty 1

## 2019-06-23 NOTE — Progress Notes (Signed)
PROGRESS NOTE  Crystal Aguirre NKN:[id number] DOB: 1975/10/07 DOA: 06/20/2019 PCP: Crystal Rim, MD  HPI/Recap of past 24 hours: Crystal Aguirre a 43 y.o.femalewith medical history significant forattention deficit disorder, history of heroin abuse, now presenting to the emergency department for evaluation of upper abdominal pain and nausea with nonbloody vomiting.Patient reports few days of upper abdominal pain with nausea and nonbloody vomiting. She has been struggling with drug abuse, has quit multiple times, last used 3 days ago, wants to achieve long-term abstinence. She developed pain in the upper abdomen, more so on the right than the left, and has had nausea with nonbloody vomiting for the past 2 to 3 days. She reports subjective fevers. She denies any cough or diarrhea.  Upon arrival to the ED, patient is found to be afebrile, saturating adequately on room air, and with systolic blood pressure of 84. Chemistry panel is concerning for hyponatremia, total bilirubin 6.4, and transaminases in the 4000 range. CBC is unremarkable. Lactic acid is normal. INR elevated to 2.2. CT of the abdomen and pelvis with abnormal gallbladder appearance and right adrenal lesion. Right upper quadrant ultrasound with marked diffuse gallbladder wall thickening, nonspecific. COVID-19 testing negative. Gastroenterology was consulted by the ED physician and recommended checking quantitative hepatitis C virus and genotype as well as discussing with ID. Patient was given 2 L of lactated Ringer's, Reglan, and Zofran in the ED.  06/23/19: Patient was seen and examined at her bedside this morning.  She was somnolent but easily arousable to voices.  States she had a rough night with right upper abdominal pain and restless leg symptoms.  Assessment/Plan: Principal Problem:   Acute hepatitis Active Problems:   Coagulopathy (HCC)   Substance abuse (HCC)   Hyponatremia   Transaminitis   Hepatitis A  antibody positive   Hepatitis C antibody test positive   Intractable right upper quadrant abdominal pain in the setting of acute hepatitis C with positive antibody and hepatitis A with positive IgM Pain management in place with IV Dilaudid PRN for severe pain Start bowel regimen to avoid opiate-induced constipation  Newly diagnosed acute hepatitis C and A Seen by infectious disease, recommended follow-up outpatient in 6 months and if hepatitis C RNA still positive may consider treatment  Acute transaminitis LFTs are trending down but still significantly elevated Avoid hepatotoxic agents  Hypokalemia Potassium 3.3 Repleted with KCl 40 mEq once  Acute dehydration Start gentle IV fluid hydration LR at 50 cc/h x 1 day Encourage oral fluid intake  Polysubstance abuse including heroin IV drug abuse Polysubstance abuse cessation counseling at bedside  Hypovolemic hyponatremia Resolved    DVT prophylaxis: SCDs/ambulation Code Status: Full code Family Communication: None Disposition Plan: Continue inpatient, Transfer from SDU to floor, awaiting specialists recommendations, IV fluid hydration, continue to follow CMP daily, likely discharge home when medically ready   Consultants:   Crystal Aguirre: Crystal Aguirre  Infectious Disease: Crystal Aguirre  Procedures:  none  Antimicrobials:  none    Objective: Vitals:   06/22/19 1339 06/22/19 2134 06/23/19 0457 06/23/19 1344  BP: 118/77 116/82 113/67 105/73  Pulse: 71 71 74 69  Resp: 16 19 18 20   Temp: 98.1 F (36.7 C) 98.6 F (37 C) 98.5 F (36.9 C) 98.5 F (36.9 C)  TempSrc: Oral  Oral   SpO2: 99% 97% 100% 100%  Weight:      Height:       No intake or output data in the 24 hours ending 06/23/19 1351 Filed Weights   06/20/19  2333 06/21/19 0830  Weight: 54.4 kg 54.4 kg    Exam:  . General: 43 y.o. year-old female well developed well nourished in no acute distress.  Somnolent but easily arousable to voices and responding to  questions appropriately. . Cardiovascular: Regular rate and rhythm with no rubs or gallops.  No thyromegaly or JVD noted.   Marland Kitchen. Respiratory: Clear to auscultation with no wheezes or rales. Good inspiratory effort. . Abdomen: Soft nontender nondistended with normal bowel sounds x4 quadrants. . Musculoskeletal: No lower extremity edema. 2/4 pulses in all 4 extremities. Marland Kitchen. Psychiatry: Mood is appropriate for condition and setting   Data Reviewed: CBC: Recent Labs  Lab 06/21/19 0001 06/21/19 1220 06/22/19 0224  WBC 3.8* 4.6 3.8*  NEUTROABS 2.2 2.8  --   HGB 14.5 14.0 12.2  HCT 44.4 42.5 37.3  MCV 86.2 85.2 87.4  PLT 291 306 300   Basic Metabolic Panel: Recent Labs  Lab 06/21/19 0001 06/21/19 1220 06/22/19 0224 06/23/19 0638  NA 131* 131* 132* 138  K 3.8 4.1 3.6 3.3*  CL 95* 96* 100 104  CO2 25 24 24 26   GLUCOSE 119* 67* 133* 98  BUN 10 9 7  <5*  CREATININE 0.58 0.53 0.62 0.53  CALCIUM 8.7* 8.4* 8.0* 8.0*   GFR: Estimated Creatinine Clearance: 75.8 mL/min (by C-G formula based on SCr of 0.53 mg/dL). Liver Function Tests: Recent Labs  Lab 06/21/19 0001 06/21/19 1220 06/22/19 0224 06/23/19 0638  AST 4,249* 3,050* 1,453* 515*  ALT 4,634* 3,726* 2,472* 1,552*  ALKPHOS 255* 216* 185* 189*  BILITOT 6.4* 6.1* 4.9* 4.8*  PROT 8.0 6.9 5.7* 5.8*  ALBUMIN 3.3* 2.9* 2.5* 2.4*   Recent Labs  Lab 06/21/19 0001 06/21/19 1220  LIPASE 96* 113*   Recent Labs  Lab 06/21/19 1220  AMMONIA 23   Coagulation Profile: Recent Labs  Lab 06/21/19 0226 06/21/19 1220 06/22/19 0224  INR 2.2* 2.1* 2.1*   Cardiac Enzymes: No results for input(s): CKTOTAL, CKMB, CKMBINDEX, TROPONINI in the last 168 hours. BNP (last 3 results) No results for input(s): PROBNP in the last 8760 hours. HbA1C: No results for input(s): HGBA1C in the last 72 hours. CBG: No results for input(s): GLUCAP in the last 168 hours. Lipid Profile: No results for input(s): CHOL, HDL, LDLCALC, TRIG, CHOLHDL,  LDLDIRECT in the last 72 hours. Thyroid Function Tests: No results for input(s): TSH, T4TOTAL, FREET4, T3FREE, THYROIDAB in the last 72 hours. Anemia Panel: No results for input(s): VITAMINB12, FOLATE, FERRITIN, TIBC, IRON, RETICCTPCT in the last 72 hours. Urine analysis:    Component Value Date/Time   COLORURINE BROWN (A) 06/21/2019 0002   APPEARANCEUR CLOUDY (A) 06/21/2019 0002   LABSPEC >1.030 (H) 06/21/2019 0002   PHURINE 6.5 06/21/2019 0002   GLUCOSEU 100 (A) 06/21/2019 0002   HGBUR NEGATIVE 06/21/2019 0002   BILIRUBINUR LARGE (A) 06/21/2019 0002   KETONESUR NEGATIVE 06/21/2019 0002   PROTEINUR NEGATIVE 06/21/2019 0002   UROBILINOGEN 1.0 10/30/2014 2231   NITRITE NEGATIVE 06/21/2019 0002   LEUKOCYTESUR TRACE (A) 06/21/2019 0002   Sepsis Labs: @LABRCNTIP (procalcitonin:4,lacticidven:4)  ) Recent Results (from the past 240 hour(s))  Urine culture     Status: Abnormal   Collection Time: 06/21/19 12:03 AM   Specimen: Urine, Random  Result Value Ref Range Status   Specimen Description   Final    URINE, RANDOM Performed at [facility] [place] [facility], [address] [place], [place] [id number]    Special Requests   Final    NONE Performed  at [facility], [address] [place], [place] [address]    Culture (A)  Final    40,000 COLONIES/mL LACTOBACILLUS SPECIES Standardized susceptibility testing for this organism is not available. Performed at [facility], [address] [place], [place] [address]    Report Status 06/22/2019 FINAL  Final  SARS Coronavirus 2 [facility] order, Performed in [facility] hospital lab) Nasopharyngeal Nasopharyngeal Swab     Status: None   Collection Time: 06/21/19  2:26 AM   Specimen: Nasopharyngeal Swab  Result Value Ref Range Status   SARS Coronavirus 2 NEGATIVE NEGATIVE Final    Comment: (NOTE) If result is NEGATIVE SARS-CoV-2 target nucleic acids are NOT DETECTED. The SARS-CoV-2 RNA is generally detectable in upper and  lower  respiratory specimens during the acute phase of infection. The lowest  concentration of SARS-CoV-2 viral copies this assay can detect is 250  copies / mL. A negative result does not preclude SARS-CoV-2 infection  and should not be used as the sole basis for treatment or other  patient management decisions.  A negative result may occur with  improper specimen collection / handling, submission of specimen other  than nasopharyngeal swab, presence of viral mutation(s) within the  areas targeted by this assay, and inadequate number of viral copies  (<250 copies / mL). A negative result must be combined with clinical  observations, patient history, and epidemiological information. If result is POSITIVE SARS-CoV-2 target nucleic acids are DETECTED. The SARS-CoV-2 RNA is generally detectable in upper and lower  respiratory specimens dur ing the acute phase of infection.  Positive  results are indicative of active infection with SARS-CoV-2.  Clinical  correlation with patient history and other diagnostic information is  necessary to determine patient infection status.  Positive results do  not rule out bacterial infection or co-infection with other viruses. If result is PRESUMPTIVE POSTIVE SARS-CoV-2 nucleic acids MAY BE PRESENT.   A presumptive positive result was obtained on the submitted specimen  and confirmed on repeat testing.  While 2019 novel coronavirus  (SARS-CoV-2) nucleic acids may be present in the submitted sample  additional confirmatory testing may be necessary for epidemiological  and / or clinical management purposes  to differentiate between  SARS-CoV-2 and other Sarbecovirus currently known to infect humans.  If clinically indicated additional testing with an alternate test  methodology [phone]) is advised. The SARS-CoV-2 RNA is generally  detectable in upper and lower respiratory sp ecimens during the acute  phase of infection. The expected result is Negative.  Fact Sheet for Patients:  BoilerBrush.com.cy Fact Sheet for Healthcare Providers: https://pope.com/ This test is not yet approved or cleared by the [place] FDA and has been authorized for detection and/or diagnosis of SARS-CoV-2 by FDA under an Emergency Use Authorization (EUA).  This EUA will remain in effect (meaning this test can be used) for the duration of the COVID-19 declaration under Section 564(b)(1) of the Act, 21 U.S.C. section 360bbb-3(b)(1), unless the authorization is terminated or revoked sooner. Performed at [facility], [address] [place], [place] [address]   MRSA PCR Screening     Status: None   Collection Time: 06/21/19 10:24 PM   Specimen: Nasopharyngeal  Result Value Ref Range Status   MRSA by PCR NEGATIVE NEGATIVE Final    Comment:        The GeneXpert MRSA Assay (FDA approved for NASAL specimens only), is one component of a comprehensive MRSA colonization surveillance program. It  is not intended to diagnose MRSA infection nor to guide or monitor treatment for MRSA infections. Performed at [facility], [address], [place], [place] [address]       Studies: No results found.  Scheduled Meds: . influenza vac split quadrivalent PF  0.5 mL Intramuscular Tomorrow-1000    Continuous Infusions:   LOS: 2 days     Darlin Drop, MD [facility] Pager [phone]  If 7PM-7AM, please contact night-coverage [email or web address] Password [email or web address] 06/23/2019, 1:51 PM

## 2019-06-23 NOTE — Progress Notes (Signed)
Pt pain uncontrolled overnight, pt c/o insomnia &  pain 9/10. Dilaudid given at 2300. Trazodone given at 2100 both ineffective. No other complaints. Will page on call. Will contine to monitor.

## 2019-06-24 DIAGNOSIS — B179 Acute viral hepatitis, unspecified: Secondary | ICD-10-CM

## 2019-06-24 DIAGNOSIS — F1910 Other psychoactive substance abuse, uncomplicated: Secondary | ICD-10-CM

## 2019-06-24 DIAGNOSIS — R7401 Elevation of levels of liver transaminase levels: Secondary | ICD-10-CM

## 2019-06-24 LAB — COMPREHENSIVE METABOLIC PANEL
ALT: 1166 U/L — ABNORMAL HIGH (ref 0–44)
AST: 250 U/L — ABNORMAL HIGH (ref 15–41)
Albumin: 2.7 g/dL — ABNORMAL LOW (ref 3.5–5.0)
Alkaline Phosphatase: 191 U/L — ABNORMAL HIGH (ref 38–126)
Anion gap: 8 (ref 5–15)
BUN: <5 mg/dL — ABNORMAL LOW (ref 6–20)
CO2: 26 mmol/L (ref 22–32)
Calcium: 8.2 mg/dL — ABNORMAL LOW (ref 8.9–10.3)
Chloride: 100 mmol/L (ref 98–111)
Creatinine, Ser: 0.52 mg/dL (ref 0.44–1.00)
GFR calc Af Amer: >60 mL/min (ref >60)
GFR calc non Af Amer: >60 mL/min (ref >60)
Glucose, Bld: 82 mg/dL (ref 70–99)
Potassium: 3.9 mmol/L (ref 3.5–5.1)
Sodium: 134 mmol/L — ABNORMAL LOW (ref 135–145)
Total Bilirubin: 5.5 mg/dL — ABNORMAL HIGH (ref 0.3–1.2)
Total Protein: 6.2 g/dL — ABNORMAL LOW (ref 6.5–8.1)

## 2019-06-24 MED ORDER — METHADONE HCL 10 MG PO TABS: 10.0000 mg | ORAL_TABLET | Freq: Two times a day (BID) | ORAL | Status: DC

## 2019-06-24 NOTE — Progress Notes (Signed)
[facility] for Infectious Disease   Reason for visit: Follow up on acute hepatitis   Interval History: hepatitis C RNA negative; transaminitis improving.  No complaints  Physical Exam: Constitutional:  Vitals:   06/23/19 2028 06/24/19 0532  BP: 116/84 119/82  Pulse: 72 63  Resp: 20 19  Temp: 98.3 F (36.8 C) 98 F (36.7 C)  SpO2: 100% 99%   patient appears in NAD Respiratory: Normal respiratory effort; CTA B Cardiovascular: RRR GI: soft, nt, nd  Review of Systems: Constitutional: negative for fevers and chills Gastrointestinal: negative for diarrhea Integument/breast: negative for rash  Lab Results  Component Value Date   WBC 3.8 (L) 06/22/2019   HGB 12.2 06/22/2019   HCT 37.3 06/22/2019   MCV 87.4 06/22/2019   PLT 300 06/22/2019    Lab Results  Component Value Date   CREATININE 0.53 06/23/2019   BUN <5 (L) 06/23/2019   NA 138 06/23/2019   K 3.3 (L) 06/23/2019   CL 104 06/23/2019   CO2 26 06/23/2019    Lab Results  Component Value Date   ALT 1,552 (H) 06/23/2019   AST 515 (H) 06/23/2019   ALKPHOS 189 (H) 06/23/2019     Microbiology: Recent Results (from the past 240 hour(s))  Urine culture     Status: Abnormal   Collection Time: 06/21/19 12:03 AM   Specimen: Urine, Random  Result Value Ref Range Status   Specimen Description   Final    URINE, RANDOM Performed at [facility], [address] [place], [place] [address]    Special Requests   Final    NONE Performed at [facility], [address] [place], [place] [address]    Culture (A)  Final    40,000 COLONIES/mL LACTOBACILLUS SPECIES Standardized susceptibility testing for this organism is not available. Performed at [facility], [place] [address], [place],  [address]    Report Status 06/22/2019 FINAL  Final  SARS Coronavirus 2 [facility] order, Performed in [facility] hospital lab) Nasopharyngeal Nasopharyngeal Swab     Status: None   Collection  Time: 06/21/19  2:26 AM   Specimen: Nasopharyngeal Swab  Result Value Ref Range Status   SARS Coronavirus 2 NEGATIVE NEGATIVE Final    Comment: (NOTE) If result is NEGATIVE SARS-CoV-2 target nucleic acids are NOT DETECTED. The SARS-CoV-2 RNA is generally detectable in upper and lower  respiratory specimens during the acute phase of infection. The lowest  concentration of SARS-CoV-2 viral copies this assay can detect is 250  copies / mL. A negative result does not preclude SARS-CoV-2 infection  and should not be used as the sole basis for treatment or other  patient management decisions.  A negative result may occur with  improper specimen collection / handling, submission of specimen other  than nasopharyngeal swab, presence of viral mutation(s) within the  areas targeted by this assay, and inadequate number of viral copies  (<250 copies / mL). A negative result must be combined with clinical  observations, patient history, and epidemiological information. If result is POSITIVE SARS-CoV-2 target nucleic acids are DETECTED. The SARS-CoV-2 RNA is generally detectable in upper and lower  respiratory specimens dur ing the acute phase of infection.  Positive  results are indicative of active infection with SARS-CoV-2.  Clinical  correlation with patient history and other diagnostic information is  necessary to determine patient infection status.  Positive results do  not rule out bacterial infection or co-infection  with other viruses. If result is PRESUMPTIVE POSTIVE SARS-CoV-2 nucleic acids MAY BE PRESENT.   A presumptive positive result was obtained on the submitted specimen  and confirmed on repeat testing.  While 2019 novel coronavirus  (SARS-CoV-2) nucleic acids may be present in the submitted sample  additional confirmatory testing may be necessary for epidemiological  and / or clinical management purposes  to differentiate between  SARS-CoV-2 and other Sarbecovirus currently known  to infect humans.  If clinically indicated additional testing with an alternate test  methodology [phone]) is advised. The SARS-CoV-2 RNA is generally  detectable in upper and lower respiratory sp ecimens during the acute  phase of infection. The expected result is Negative. Fact Sheet for Patients:  BoilerBrush.com.cy Fact Sheet for Healthcare Providers: https://pope.com/ This test is not yet approved or cleared by the [place] FDA and has been authorized for detection and/or diagnosis of SARS-CoV-2 by FDA under an Emergency Use Authorization (EUA).  This EUA will remain in effect (meaning this test can be used) for the duration of the COVID-19 declaration under Section 564(b)(1) of the Act, 21 U.S.C. section 360bbb-3(b)(1), unless the authorization is terminated or revoked sooner. Performed at [facility], [address] [place], [place] [address]   MRSA PCR Screening     Status: None   Collection Time: 06/21/19 10:24 PM   Specimen: Nasopharyngeal  Result Value Ref Range Status   MRSA by PCR NEGATIVE NEGATIVE Final    Comment:        The GeneXpert MRSA Assay (FDA approved for NASAL specimens only), is one component of a comprehensive MRSA colonization surveillance program. It is not intended to diagnose MRSA infection nor to guide or monitor treatment for MRSA infections. Performed at [facility], [address], [place], [place] [address]     Impression/Plan:  1. Acute hepatitis - hepatitis C not active so would not be consistent with acute hepatitis.  Likely from Hepatitis A.  Health department automatically notified.   Continue supportive care Can repeat transaminases in 1 week  2.substance abuse - needs continued efforts at avoiding IVDU. I counseled that she can get hepatitis C again with IVDU.   No need for her to follow up with me

## 2019-06-24 NOTE — Progress Notes (Signed)
Pt left AMA. Pt signed the AMA form, but stated she did not want any assistance or was not interested sobriety. MD notified.

## 2019-06-24 NOTE — Progress Notes (Signed)
PROGRESS NOTE  Crystal Aguirre TIR:[id number] DOB: [birth year] DOA: 06/20/2019 PCP: Vivien Presto, MD  HPI/Recap of past 24 hours: Crystal Aguirre a 43 y.o.femalewith medical history significant forattention deficit disorder, history of heroin abuse, now presenting to the emergency department for evaluation of upper abdominal pain and nausea with nonbloody vomiting.Patient reports few days of upper abdominal pain with nausea and nonbloody vomiting. She has been struggling with drug abuse, has quit multiple times, last used 3 days ago, wants to achieve long-term abstinence. She developed pain in the upper abdomen, more so on the right than the left, and has had nausea with nonbloody vomiting for the past 2 to 3 days. She reports subjective fevers. She denies any cough or diarrhea.  Upon arrival to the ED, patient is found to be afebrile, saturating adequately on room air, and with systolic blood pressure of 84. Chemistry panel is concerning for hyponatremia, total bilirubin 6.4, and transaminases in the 4000 range. CBC is unremarkable. Lactic acid is normal. INR elevated to 2.2. CT of the abdomen and pelvis with abnormal gallbladder appearance and right adrenal lesion. Right upper quadrant ultrasound with marked diffuse gallbladder wall thickening, nonspecific. COVID-19 testing negative. Gastroenterology was consulted by the ED physician and recommended checking quantitative hepatitis C virus and genotype as well as discussing with ID. Patient was given 2 L of lactated Ringer's, Reglan, and Zofran in the ED.  06/24/19: Patient seen and examined at bedside this morning.  No acute events overnight.  Abdominal pain improved with IV pain medications.  Will DC IV pain medications to see how she tolerates.  If she is still interested in sobriety will start methadone after obtaining a 12-lead EKG.  Acute hepatitis/transaminitis, labs ordered and pending.     Assessment/Plan: Principal  Problem:   Acute hepatitis Active Problems:   Coagulopathy (HCC)   Substance abuse (HCC)   Hyponatremia   Transaminitis   Hepatitis A antibody positive   Hepatitis C antibody test positive   Intractable right upper quadrant abdominal pain in the setting of hepatitis C with positive antibody and hepatitis A with positive IgM DC IV Dilaudid If she still interested in sobriety will start methadone Hepatitis C RNA not active so would not be consistent with acute hepatitis  Suspected chronic hepatitis C and acute hepatitis A Continue supportive care CMP pending  Acute transaminitis secondary to hepatitis C & hepatitis A Continue to avoid hepatotoxins CMP pending  IV drug abuse with heroine If she is still interested in sobriety will obtain twelve-lead EKG and get her started on methadone Will need to follow-up with methadone clinic if started on  Hypokalemia Potassium 3.3 Repleted with KCl 40 mEq once Repeat labs pending  Acute dehydration Continue gentle IV fluid hydration LR at 50 cc/h x 1 day Encourage oral fluid intake  Polysubstance abuse including heroin IV drug abuse Polysubstance abuse cessation counseling at bedside  Resolved hypovolemic hyponatremia Sodium 138 on 06/23/2019    DVT prophylaxis: SCDs/ambulation Code Status: Full code Family Communication: None  Disposition Plan:  Patient is currently not appropriate for discharge at this time due to transaminitis and acute hepatitis A.  Consultants:   [facility]: Dr. Ewing Schlein  Infectious Disease: Dr. Luciana Axe  Procedures:  none  Antimicrobials:  none    Objective: Vitals:   06/23/19 0457 06/23/19 1344 06/23/19 2028 06/24/19 0532  BP: 113/67 105/73 116/84 119/82  Pulse: 74 69 72 63  Resp: 18 20 20 19   Temp: 98.5 F (36.9 C) 98.5 F (  36.9 C) 98.3 F (36.8 C) 98 F (36.7 C)  TempSrc: Oral   Oral  SpO2: 100% 100% 100% 99%  Weight:      Height:        Intake/Output Summary (Last 24 hours)  at 06/24/2019 1314 Last data filed at 06/23/2019 1900 Gross per 24 hour  Intake 214.33 ml  Output -  Net 214.33 ml   Filed Weights   06/20/19 2333 06/21/19 0830  Weight: 54.4 kg 54.4 kg    Exam:  . General: 43 y.o. year-old female well-developed well-nourished no acute distress.  Alert and oriented x3.   . Cardiovascular: Regular rate and rhythm no rubs or gallops no JVD or thyromegaly noted.   Marland Kitchen Respiratory: Clear to auscultation no wheezes or rales.  Poor inspiratory effort.   .  Musculoskeletal: No lower extremity edema.  Marland Kitchen psychiatry: Mood is appropriate for condition and setting.   Data Reviewed: CBC: Recent Labs  Lab 06/21/19 0001 06/21/19 1220 06/22/19 0224  WBC 3.8* 4.6 3.8*  NEUTROABS 2.2 2.8  --   HGB 14.5 14.0 12.2  HCT 44.4 42.5 37.3  MCV 86.2 85.2 87.4  PLT 291 306 300   Basic Metabolic Panel: Recent Labs  Lab 06/21/19 0001 06/21/19 1220 06/22/19 0224 06/23/19 0638  NA 131* 131* 132* 138  K 3.8 4.1 3.6 3.3*  CL 95* 96* 100 104  CO2 GLUCOSE 119* 67* 133* 98  BUN <5*  CREATININE 0.58 0.53 0.62 0.53  CALCIUM 8.7* 8.4* 8.0* 8.0*   GFR: Estimated Creatinine Clearance: 75.8 mL/min (by C-G formula based on SCr of 0.53 mg/dL). Liver Function Tests: Recent Labs  Lab 06/21/19 0001 06/21/19 1220 06/22/19 0224 06/23/19 0638  AST 4,249* 3,050* 1,453* 515*  ALT 4,634* 3,726* 2,472* 1,552*  ALKPHOS 255* 216* 185* 189*  BILITOT 6.4* 6.1* 4.9* 4.8*  PROT 8.0 6.9 5.7* 5.8*  ALBUMIN 3.3* 2.9* 2.5* 2.4*   Recent Labs  Lab 06/21/19 0001 06/21/19 1220  LIPASE 96* 113*   Recent Labs  Lab 06/21/19 1220  AMMONIA 23   Coagulation Profile: Recent Labs  Lab 06/21/19 0226 06/21/19 1220 06/22/19 0224  INR 2.2* 2.1* 2.1*   Cardiac Enzymes: No results for input(s): CKTOTAL, CKMB, CKMBINDEX, TROPONINI in the last 168 hours. BNP (last 3 results) No results for input(s): PROBNP in the last 8760 hours. HbA1C: No results for  input(s): HGBA1C in the last 72 hours. CBG: No results for input(s): GLUCAP in the last 168 hours. Lipid Profile: No results for input(s): CHOL, HDL, LDLCALC, TRIG, CHOLHDL, LDLDIRECT in the last 72 hours. Thyroid Function Tests: No results for input(s): TSH, T4TOTAL, FREET4, T3FREE, THYROIDAB in the last 72 hours. Anemia Panel: No results for input(s): VITAMINB12, FOLATE, FERRITIN, TIBC, IRON, RETICCTPCT in the last 72 hours. Urine analysis:    Component Value Date/Time   COLORURINE BROWN (A) 06/21/2019 0002   APPEARANCEUR CLOUDY (A) 06/21/2019 0002   LABSPEC >1.030 (H) 06/21/2019 0002   PHURINE 6.5 06/21/2019 0002   GLUCOSEU 100 (A) 06/21/2019 0002   HGBUR NEGATIVE 06/21/2019 0002   BILIRUBINUR LARGE (A) 06/21/2019 0002   KETONESUR NEGATIVE 06/21/2019 0002   PROTEINUR NEGATIVE 06/21/2019 0002   UROBILINOGEN 1.0 10/30/2014 2231   NITRITE NEGATIVE 06/21/2019 0002   LEUKOCYTESUR TRACE (A) 06/21/2019 0002   Sepsis Labs: (procalcitonin:4,lacticidven:4)  ) Recent Results (from the past 240 hour(s))  Urine culture     Status: Abnormal   Collection Time: 06/21/19 12:03 AM  Specimen: Urine, Random  Result Value Ref Range Status   Specimen Description   Final    URINE, RANDOM Performed at [facility] [place], [address] [place], [place] [address]    Special Requests   Final    NONE Performed at [facility] [place], [address] [place], [place] [address]    Culture (A)  Final    40,000 COLONIES/mL LACTOBACILLUS SPECIES Standardized susceptibility testing for this organism is not available. Performed at [facility], [address] [place], [place] [address]    Report Status 06/22/2019 FINAL  Final  SARS Coronavirus 2 [facility](Hospital order, Performed in [facility] hospital lab) Nasopharyngeal Nasopharyngeal Swab     Status: None   Collection Time: 06/21/19  2:26 AM   Specimen: Nasopharyngeal Swab  Result Value Ref Range Status   SARS Coronavirus  2 NEGATIVE NEGATIVE Final    Comment: (NOTE) If result is NEGATIVE SARS-CoV-2 target nucleic acids are NOT DETECTED. The SARS-CoV-2 RNA is generally detectable in upper and lower  respiratory specimens during the acute phase of infection. The lowest  concentration of SARS-CoV-2 viral copies this assay can detect is 250  copies / mL. A negative result does not preclude SARS-CoV-2 infection  and should not be used as the sole basis for treatment or other  patient management decisions.  A negative result may occur with  improper specimen collection / handling, submission of specimen other  than nasopharyngeal swab, presence of viral mutation(s) within the  areas targeted by this assay, and inadequate number of viral copies  (<250 copies / mL). A negative result must be combined with clinical  observations, patient history, and epidemiological information. If result is POSITIVE SARS-CoV-2 target nucleic acids are DETECTED. The SARS-CoV-2 RNA is generally detectable in upper and lower  respiratory specimens dur ing the acute phase of infection.  Positive  results are indicative of active infection with SARS-CoV-2.  Clinical  correlation with patient history and other diagnostic information is  necessary to determine patient infection status.  Positive results do  not rule out bacterial infection or co-infection with other viruses. If result is PRESUMPTIVE POSTIVE SARS-CoV-2 nucleic acids MAY BE PRESENT.   A presumptive positive result was obtained on the submitted specimen  and confirmed on repeat testing.  While 2019 novel coronavirus  (SARS-CoV-2) nucleic acids may be present in the submitted sample  additional confirmatory testing may be necessary for epidemiological  and / or clinical management purposes  to differentiate between  SARS-CoV-2 and other Sarbecovirus currently known to infect humans.  If clinically indicated additional testing with an alternate test  methodology  [phone]([id number]) is advised. The SARS-CoV-2 RNA is generally  detectable in upper and lower respiratory sp ecimens during the acute  phase of infection. The expected result is Negative. Fact Sheet for Patients:  BoilerBrush.com.cyhttps://www.fda.gov/media/136312/download Fact Sheet for Healthcare Providers: https://pope.com/https://www.fda.gov/media/136313/download This test is not yet approved or cleared by the [place] FDA and has been authorized for detection and/or diagnosis of SARS-CoV-2 by FDA under an Emergency Use Authorization (EUA).  This EUA will remain in effect (meaning this test can be used) for the duration of the COVID-19 declaration under Section 564(b)(1) of the Act, 21 U.S.C. section 360bbb-3(b)(1), unless the authorization is terminated or revoked sooner. Performed at [facility] [place], [address] [place], [place] [address]   MRSA PCR Screening     Status: None   Collection Time: 06/21/19 10:24 PM  Specimen: Nasopharyngeal  Result Value Ref Range Status   MRSA by PCR NEGATIVE NEGATIVE Final    Comment:        The GeneXpert MRSA Assay (FDA approved for NASAL specimens only), is one component of a comprehensive MRSA colonization surveillance program. It is not intended to diagnose MRSA infection nor to guide or monitor treatment for MRSA infections. Performed at [facility], [place] [address], [place], [place] [address]       Studies: No results found.  Scheduled Meds: . influenza vac split quadrivalent PF  0.5 mL Intramuscular Tomorrow-1000  . senna-docusate  2 tablet Oral BID    Continuous Infusions: . lactated ringers 50 mL/hr at 06/23/19 1900     LOS: 3 days     Kayleen Memos, MD [facility] Pager [phone]  If 7PM-7AM, please contact night-coverage [email or web address] Password [email or web address] 06/24/2019, 1:14 PM

## 2019-06-25 NOTE — Discharge Summary (Signed)
Discharge Summary  Crystal Aguirre QQP:[id number] DOB: [birth year]  PCP: Curly Rim, MD  Admit date: 06/20/2019 Discharge date: 06/25/2019    Recommendations for Outpatient Follow-up:  1. Left AGAINST MEDICAL ADVICE  Discharge Diagnoses:  Active Hospital Problems   Diagnosis Date Noted   Acute hepatitis 06/21/2019   Hepatitis A antibody positive 06/22/2019   Hepatitis C antibody test positive 06/22/2019   Transaminitis    Coagulopathy ([place]) 06/21/2019   Substance abuse ([place]) 06/21/2019   Hyponatremia 06/21/2019    Resolved Hospital Problems  No resolved problems to display.     Vitals:   06/23/19 2028 06/24/19 0532  BP: 116/84 119/82  Pulse: 72 63  Resp: 20 19  Temp: 98.3 F (36.8 C) 98 F (36.7 C)  SpO2: 100% 99%    History of present illness:  Crystal Aguirre a 43 y.o.femalewith medical history significant forattention deficit disorder, history of heroin abuse, now presenting to the emergency department for evaluation of upper abdominal pain and nausea with nonbloody vomiting.Patient reports few days of upper abdominal pain with nausea and nonbloody vomiting. She has been struggling with drug abuse, has quit multiple times, last used 3 days ago, wants to achieve long-term abstinence. She developed pain in the upper abdomen, more so on the right than the left, and has had nausea with nonbloody vomiting for the past 2 to 3 days. She reports subjective fevers. She denies any cough or diarrhea.  Upon arrival to the ED, patient is found to be afebrile, saturating adequately on room air, and with systolic blood pressure of 84. Chemistry panel is concerning for hyponatremia, total bilirubin 6.4, and transaminases in the 4000 range. CBC is unremarkable. Lactic acid is normal. INR elevated to 2.2. CT of the abdomen and pelvis with abnormal gallbladder appearance and right adrenal lesion. Right upper quadrant ultrasound with marked diffuse gallbladder  wall thickening, nonspecific. COVID-19 testing negative. Gastroenterology was consulted by the ED physician and recommended checking quantitative hepatitis C virus and genotype as well as discussing with ID. Patient was given 2 L of lactated Ringer's, Reglan, and Zofran in the ED.  06/24/19: Patient seen and examined at bedside this morning.  No acute events overnight.  Abdominal pain improved with IV pain medications.  Will DC IV pain medications to see how she tolerates.  If she is still interested in sobriety will start methadone after obtaining a 12-lead EKG.  Acute hepatitis/transaminitis, labs ordered and pending.    Hospital Course:  Principal Problem:   Acute hepatitis Active Problems:   Coagulopathy ([place])   Substance abuse ([facility])   Hyponatremia   Transaminitis   Hepatitis A antibody positive   Hepatitis C antibody test positive  Intractable right upper quadrant abdominal pain in the setting of hepatitis C with positive antibody and hepatitis A with positive IgM DC IV Dilaudid If she still interested in sobriety will start methadone Hepatitis C RNA not active so would not be consistent with acute hepatitis  Suspected chronic hepatitis C and acute hepatitis A Continue supportive care CMP pending  Acute transaminitis secondary to hepatitis C & hepatitis A Continue to avoid hepatotoxins CMP pending  IV drug abuse with heroine If she is still interested in sobriety will obtain twelve-lead EKG and get her started on methadone Will need to follow-up with methadone clinic if started on  Hypokalemia Potassium 3.3 Repleted with KCl 40 mEq once Repeat labs pending  Acute dehydration Continue gentle IV fluid hydration LR at 50 cc/h x 1 day Encourage oral  fluid intake  Polysubstance abuse including heroin IV drug abuse Polysubstance abuse cessation counseling at bedside  Resolved hypovolemic hyponatremia Sodium 138 on 06/23/2019    Code Status:Full  code   Consultants:  [facility]: Dr. Ewing SchleinMagod  Infectious Disease: Dr. Luciana Axeomer      Discharge Exam: BP 119/82 (BP Location: Left Arm)    Pulse 63    Temp 98 F (36.7 C) (Oral)    Resp 19    Ht 5\' 3"  (1.6 m)    Wt 54.4 kg    LMP 05/29/2019 Comment: ncp per patient//ac   SpO2 99%    BMI 21.26 kg/m   General: 43 y.o. year-old female well developed well nourished in no acute distress.  Alert and oriented x3.  Cardiovascular: Regular rate and rhythm with no rubs or gallops.  No thyromegaly or JVD noted.    Respiratory: Clear to auscultation with no wheezes or rales. Good inspiratory effort.  Abdomen: Soft nontender nondistended with normal bowel sounds x4 quadrants.  Musculoskeletal: No lower extremity edema. 2/4 pulses in all 4 extremities.  Psychiatry: Mood is appropriate for condition and setting  Discharge Instructions You were cared for by a hospitalist during your hospital stay. If you have any questions about your discharge medications or the care you received while you were in the hospital after you are discharged, you can call the unit and asked to speak with the hospitalist on call if the hospitalist that took care of you is not available. Once you are discharged, your primary care physician will handle any further medical issues. Please note that NO REFILLS for any discharge medications will be authorized once you are discharged, as it is imperative that you return to your primary care physician (or establish a relationship with a primary care physician if you do not have one) for your aftercare needs so that they can reassess your need for medications and monitor your lab values.   Allergies as of 06/24/2019      Reactions   Latex Rash, Swelling   Penicillin G Hives   Soy Allergy Hives   Sulfa Antibiotics Other (See Comments), Hives   Joint pain Other reaction(s): Unknown Joint pain      Medication List    ASK your doctor about these medications   acetaminophen 325  MG tablet Commonly known as: TYLENOL Take 650 mg by mouth every 6 (six) hours as needed for moderate pain or headache.   Dexilant 60 MG capsule Generic drug: dexlansoprazole Take 60 mg by mouth daily. Ask about: Which instructions should I use?   ibuprofen 600 MG tablet Commonly known as: ADVIL Take 1 tablet (600 mg total) by mouth every 6 (six) hours as needed.      Allergies  Allergen Reactions   Latex Rash and Swelling   Penicillin G Hives   Soy Allergy Hives   Sulfa Antibiotics Other (See Comments) and Hives    Joint pain Other reaction(s): Unknown Joint pain      The results of significant diagnostics from this hospitalization (including imaging, microbiology, ancillary and laboratory) are listed below for reference.    Significant Diagnostic Studies: Ct Abdomen Pelvis W Contrast  Result Date: 06/21/2019 CLINICAL DATA:  Abdominal pain with fever EXAM: CT ABDOMEN AND PELVIS WITH CONTRAST TECHNIQUE: Multidetector CT imaging of the abdomen and pelvis was performed using the standard protocol following bolus administration of intravenous contrast. CONTRAST:  100mL OMNIPAQUE IOHEXOL 300 MG/ML  SOLN COMPARISON:  None. FINDINGS: Lower chest: Lung bases demonstrate  no acute consolidation or effusion. The heart size is normal Hepatobiliary: No focal hepatic abnormality. Mild periportal edema. Abnormal appearance of gallbladder with what appears to be severe wall thickening. No calcified stone. No biliary dilatation Pancreas: Unremarkable. No pancreatic ductal dilatation or surrounding inflammatory changes. Spleen: Upper normal in size at 13 cm. Adrenals/Urinary Tract: 5.6 cm low attenuation right adrenal mass with peripheral calcification. Small stone measuring 4 mm in the mid right kidney. The bladder is unremarkable Stomach/Bowel: Stomach is within normal limits. Appendix not well seen but no right lower quadrant inflammatory process. No evidence of bowel wall thickening,  distention, or inflammatory changes. Vascular/Lymphatic: Nonaneurysmal aorta. No significantly enlarged lymph nodes Reproductive: Uterus and bilateral adnexa are unremarkable. Other: No free air.  Small free fluid in the pelvis Musculoskeletal: No acute or significant osseous findings. IMPRESSION: 1. Abnormal appearance of the gallbladder with what appears to be severe wall thickening, consider correlation with right upper quadrant abdominal ultrasound. 2. 5.6 cm low-attenuation lesion in the right adrenal gland either representing a cyst or adenoma. 3. 4 mm stone in the right kidney.  No hydronephrosis. Electronically Signed   By: Jasmine Pang M.D.   On: 06/21/2019 02:41   US Abdomen Limited Ruq  Result Date: 06/21/2019 CLINICAL DATA:  Abdominal pain and fever. Transaminitis. Abnormal gallbladder on recent CT. EXAM: ULTRASOUND ABDOMEN LIMITED RIGHT UPPER QUADRANT COMPARISON:  CT on 06/21/2019 FINDINGS: Gallbladder: Marked diffuse gallbladder wall thickening is seen measuring up to 17 mm. The gallbladder lumen is poorly distended which limits evaluation, however no large gallstones are seen. No evidence of pericholecystic fluid. Common bile duct: Diameter: 3 mm, within normal limits. Liver: No focal lesion identified. Within normal limits in parenchymal echogenicity. Portal vein is patent on color Doppler imaging with normal direction of blood flow towards the liver. Other: None. IMPRESSION: Marked diffuse gallbladder wall thickening which limits evaluation for gallstones. No large gallstones or gallbladder distention seen. This finding is nonspecific, and can be secondary to many other etiologies including hepatitis and diffuse hepatocellular disease. Unremarkable sonographic appearance of hepatic parenchyma. No evidence of biliary ductal dilatation. Electronically Signed   By: Danae Orleans M.D.   On: 06/21/2019 09:16    Microbiology: Recent Results (from the past 240 hour(s))  Urine culture     Status:  Abnormal   Collection Time: 06/21/19 12:03 AM   Specimen: Urine, Random  Result Value Ref Range Status   Specimen Description   Final    URINE, RANDOM Performed at [facility], [address] [place], [place] [address]    Special Requests   Final    NONE Performed at [facility], [address] [place], [place] [address]    Culture (A)  Final    40,000 COLONIES/mL LACTOBACILLUS SPECIES Standardized susceptibility testing for this organism is not available. Performed at [facility], [address] [place], [place] [address]    Report Status 06/22/2019 FINAL  Final  SARS Coronavirus 2 [facility] order, Performed in [facility] hospital lab) Nasopharyngeal Nasopharyngeal Swab     Status: None   Collection Time: 06/21/19  2:26 AM   Specimen: Nasopharyngeal Swab  Result Value Ref Range Status   SARS Coronavirus 2 NEGATIVE NEGATIVE Final    Comment: (NOTE) If result is NEGATIVE SARS-CoV-2 target nucleic acids are NOT DETECTED. The SARS-CoV-2 RNA is generally detectable in upper and lower  respiratory specimens during the acute phase of infection. The lowest  concentration of SARS-CoV-2  viral copies this assay can detect is 250  copies / mL. A negative result does not preclude SARS-CoV-2 infection  and should not be used as the sole basis for treatment or other  patient management decisions.  A negative result may occur with  improper specimen collection / handling, submission of specimen other  than nasopharyngeal swab, presence of viral mutation(s) within the  areas targeted by this assay, and inadequate number of viral copies  (<250 copies / mL). A negative result must be combined with clinical  observations, patient history, and epidemiological information. If result is POSITIVE SARS-CoV-2 target nucleic acids are DETECTED. The SARS-CoV-2 RNA is generally detectable in upper and lower  respiratory specimens dur ing the acute phase of infection.   Positive  results are indicative of active infection with SARS-CoV-2.  Clinical  correlation with patient history and other diagnostic information is  necessary to determine patient infection status.  Positive results do  not rule out bacterial infection or co-infection with other viruses. If result is PRESUMPTIVE POSTIVE SARS-CoV-2 nucleic acids MAY BE PRESENT.   A presumptive positive result was obtained on the submitted specimen  and confirmed on repeat testing.  While 2019 novel coronavirus  (SARS-CoV-2) nucleic acids may be present in the submitted sample  additional confirmatory testing may be necessary for epidemiological  and / or clinical management purposes  to differentiate between  SARS-CoV-2 and other Sarbecovirus currently known to infect humans.  If clinically indicated additional testing with an alternate test  methodology [phone]) is advised. The SARS-CoV-2 RNA is generally  detectable in upper and lower respiratory sp ecimens during the acute  phase of infection. The expected result is Negative. Fact Sheet for Patients:  BoilerBrush.com.cy Fact Sheet for Healthcare Providers: https://pope.com/ This test is not yet approved or cleared by the [place] FDA and has been authorized for detection and/or diagnosis of SARS-CoV-2 by FDA under an Emergency Use Authorization (EUA).  This EUA will remain in effect (meaning this test can be used) for the duration of the COVID-19 declaration under Section 564(b)(1) of the Act, 21 U.S.C. section 360bbb-3(b)(1), unless the authorization is terminated or revoked sooner. Performed at [facility], [address] [place], [place] [address]   MRSA PCR Screening     Status: None   Collection Time: 06/21/19 10:24 PM   Specimen: Nasopharyngeal  Result Value Ref Range Status   MRSA by PCR NEGATIVE NEGATIVE Final    Comment:        The GeneXpert MRSA Assay (FDA approved  for NASAL specimens only), is one component of a comprehensive MRSA colonization surveillance program. It is not intended to diagnose MRSA infection nor to guide or monitor treatment for MRSA infections. Performed at [facility], [address] [place], [place] [address]      Labs: Basic Metabolic Panel: Recent Labs  Lab 06/21/19 0001 06/21/19 1220 06/22/19 0224 06/23/19 0638 06/24/19 0703  NA 131* 131* 132* 138 134*  K 3.8 4.1 3.6 3.3* 3.9  CL 95* 96* 100 104 100  CO2 25 24 24 26 26   GLUCOSE 119* 67* 133* 98 82  BUN 10 9 7  <5* <5*  CREATININE 0.58 0.53 0.62 0.53 0.52  CALCIUM 8.7* 8.4* 8.0* 8.0* 8.2*   Liver Function Tests: Recent Labs  Lab 06/21/19 0001 06/21/19 1220 06/22/19 0224 06/23/19 0638 06/24/19 0703  AST 4,249* 3,050* 1,453* 515* 250*  ALT 4,634* 3,726* 2,472* 1,552* 1,166*  ALKPHOS 255* 216* 185* 189*  191*  BILITOT 6.4* 6.1* 4.9* 4.8* 5.5*  PROT 8.0 6.9 5.7* 5.8* 6.2*  ALBUMIN 3.3* 2.9* 2.5* 2.4* 2.7*   Recent Labs  Lab 06/21/19 0001 06/21/19 1220  LIPASE 96* 113*   Recent Labs  Lab 06/21/19 1220  AMMONIA 23   CBC: Recent Labs  Lab 06/21/19 0001 06/21/19 1220 06/22/19 0224  WBC 3.8* 4.6 3.8*  NEUTROABS 2.2 2.8  --   HGB 14.5 14.0 12.2  HCT 44.4 42.5 37.3  MCV 86.2 85.2 87.4  PLT 291 306 300   Cardiac Enzymes: No results for input(s): CKTOTAL, CKMB, CKMBINDEX, TROPONINI in the last 168 hours. BNP: BNP (last 3 results) No results for input(s): BNP in the last 8760 hours.  ProBNP (last 3 results) No results for input(s): PROBNP in the last 8760 hours.  CBG: No results for input(s): GLUCAP in the last 168 hours.     Signed:  Darlin Drop, MD [facility] 06/25/2019, 4:44 PM

## 2019-06-27 LAB — HEPATITIS C GENOTYPE

## 2019-08-06 ENCOUNTER — Inpatient Hospital Stay: Payer: Medicaid Other | Admitting: Internal Medicine

## 2021-07-21 IMAGING — US US ABDOMEN LIMITED
1 series · 14 of 25 positions shown · non-contrast
Comparison: CT on 06/21/2019

CLINICAL DATA: Abdominal pain and fever. Transaminitis. Abnormal
gallbladder on recent CT.

EXAM:
ULTRASOUND ABDOMEN LIMITED RIGHT UPPER QUADRANT

[Series 1: us abdomen limited · 14 of 99 slices shown]
[im 1/99]
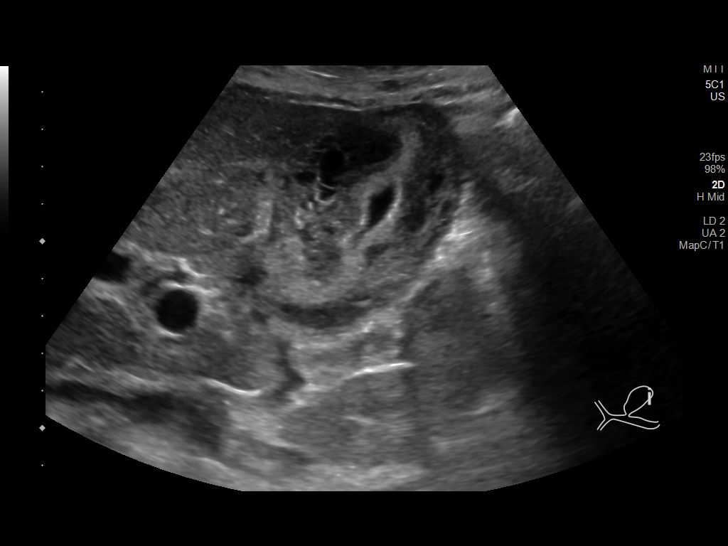
[im 9/99]
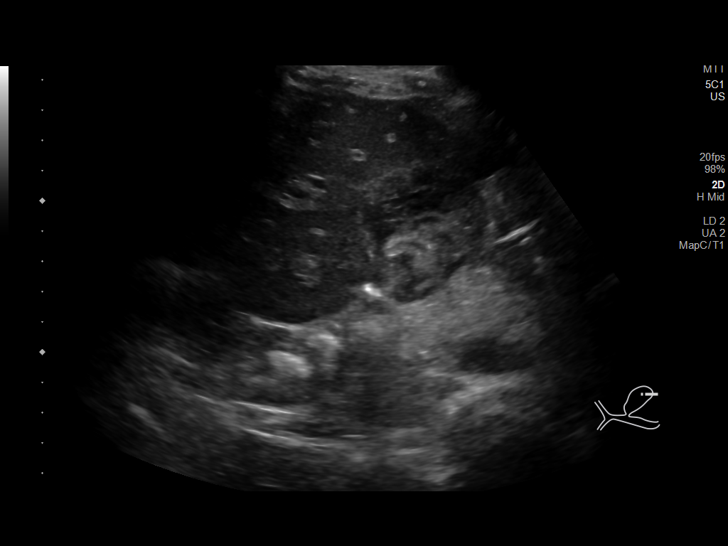
[im 17/99]
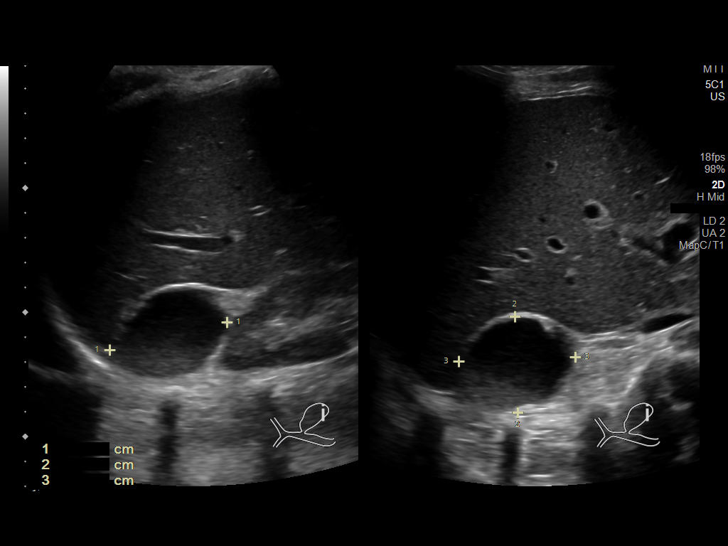
[im 25/99]
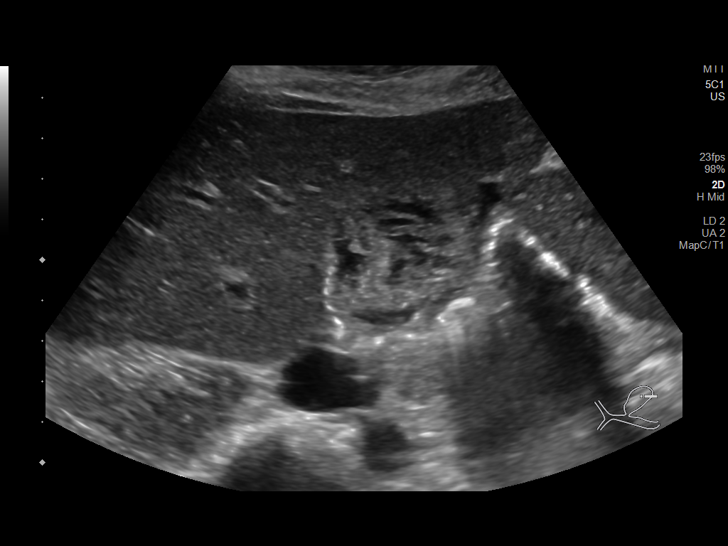
[im 33/99]
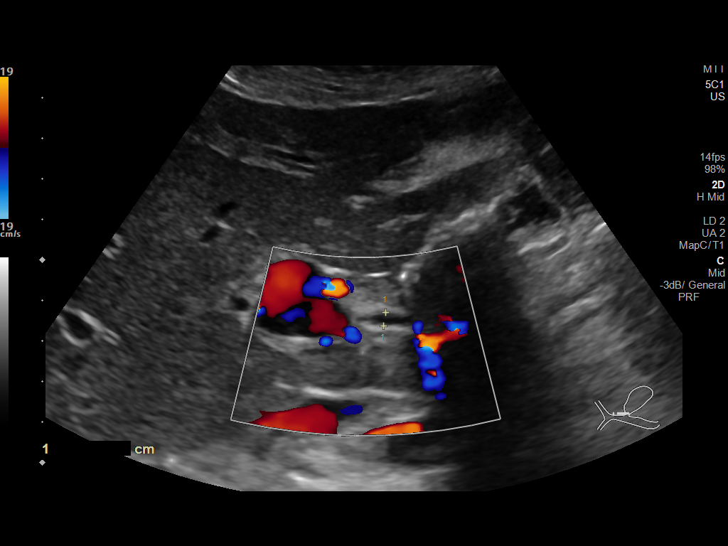
[im 37/99]
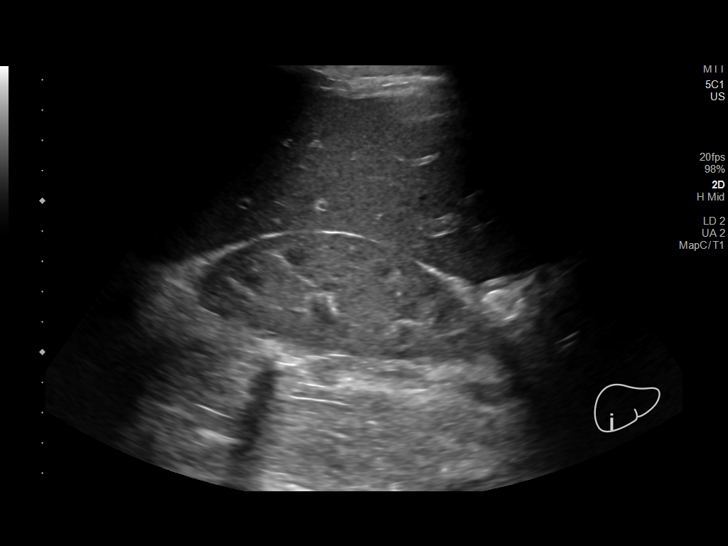
[im 45/99]
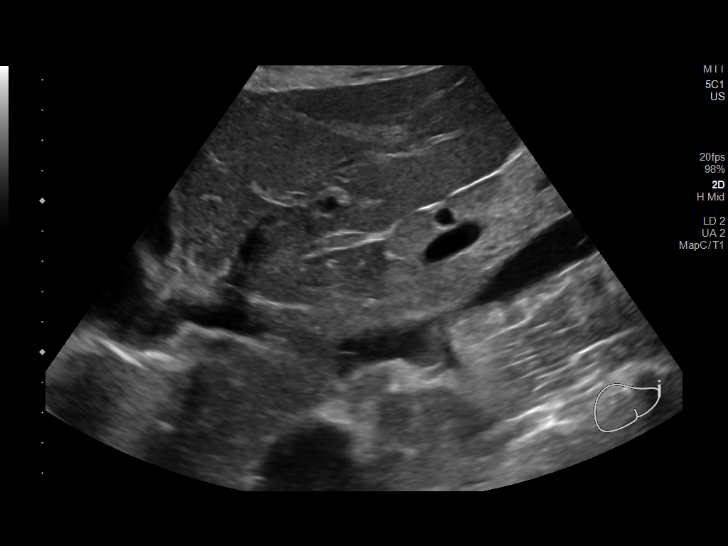
[im 54/99]
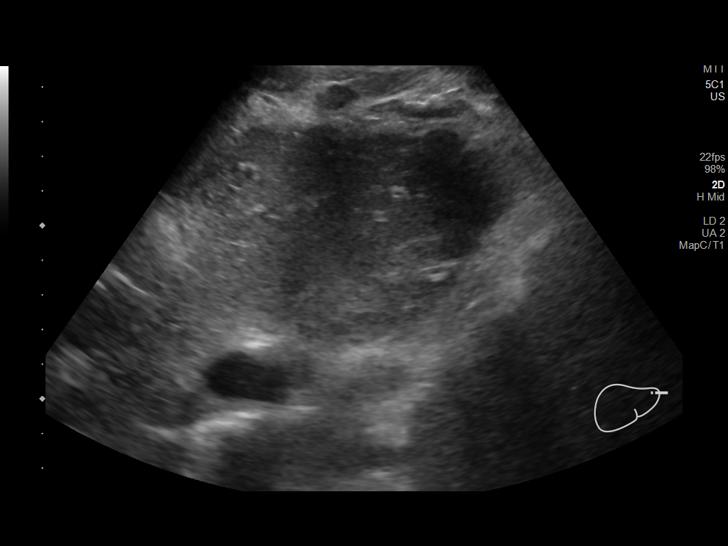
[im 62/99]
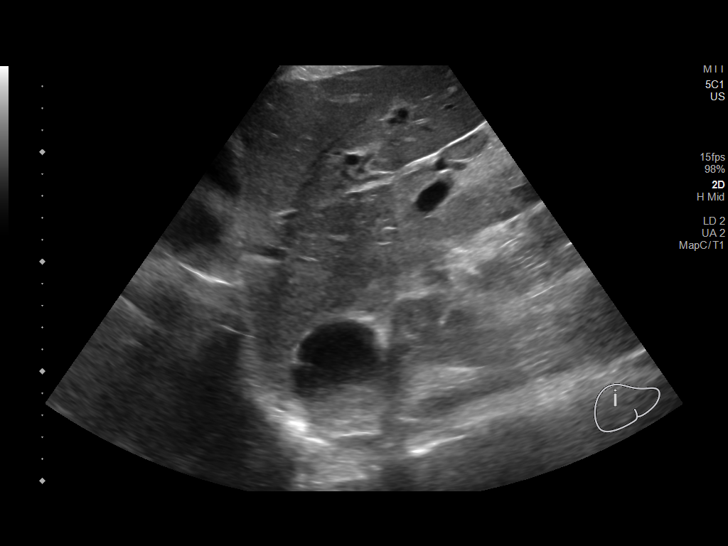
[im 66/99]
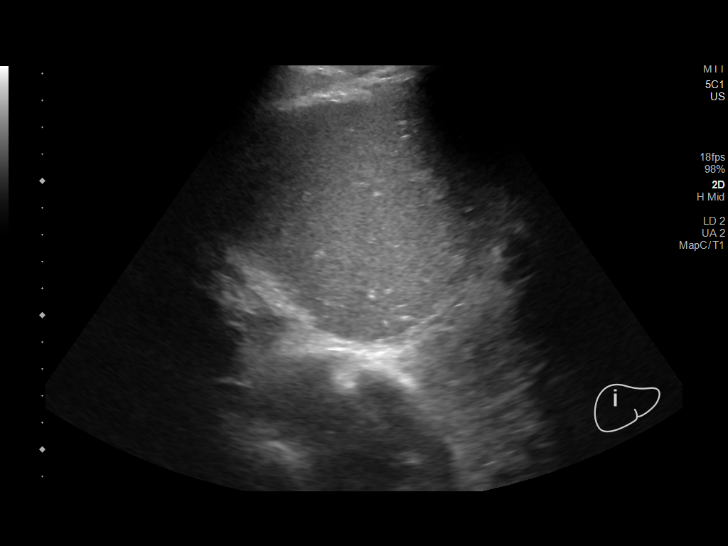
[im 74/99]
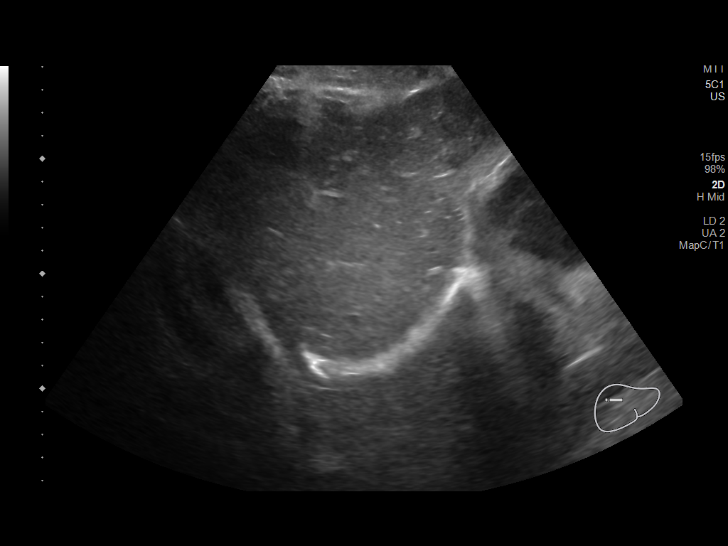
[im 82/99]
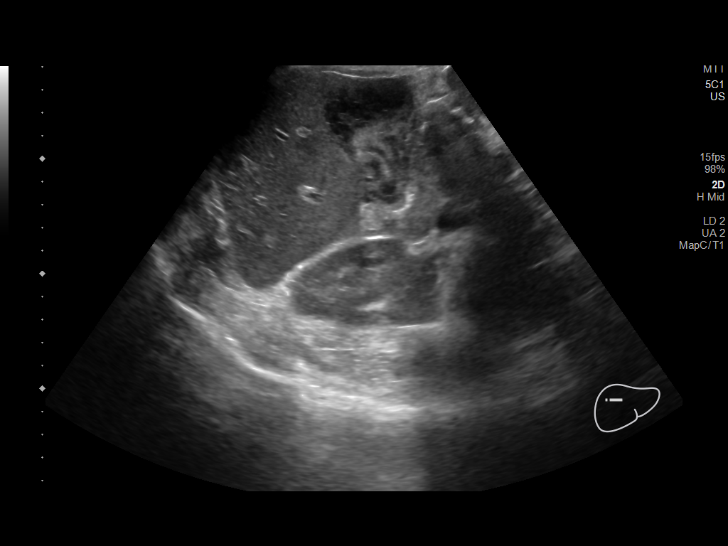
[im 90/99]
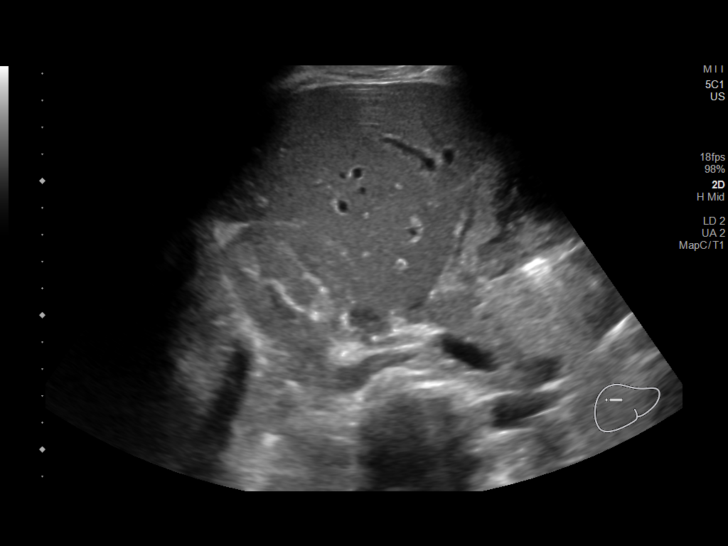
[im 99/99]
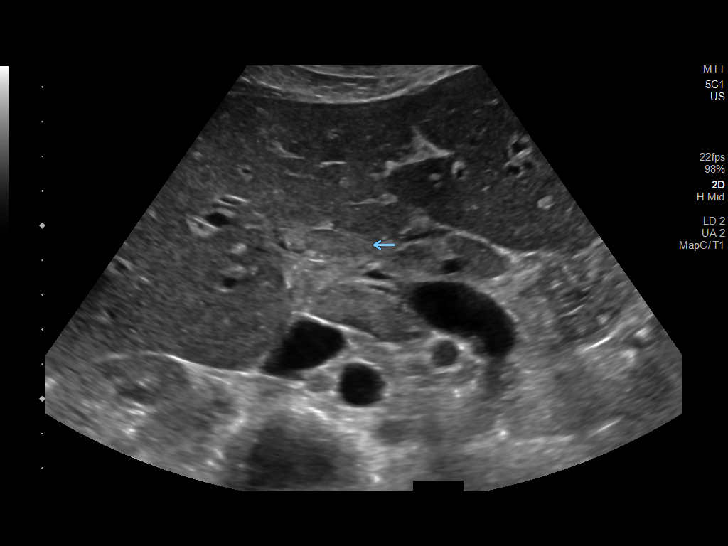

[14 of 25 positions shown; findings below may reference images not displayed]

FINDINGS: Gallbladder:

Marked diffuse gallbladder wall thickening is seen measuring up to
17 mm. The gallbladder lumen is poorly distended which limits
evaluation, however no large gallstones are seen. No evidence of
pericholecystic fluid.

Common bile duct:

Diameter: 3 mm, within normal limits.

Liver:

No focal lesion identified. Within normal limits in parenchymal
echogenicity. Portal vein is patent on color Doppler imaging with
normal direction of blood flow towards the liver.

Other: None.
IMPRESSION: Marked diffuse gallbladder wall thickening which limits evaluation
for gallstones. No large gallstones or gallbladder distention seen.
This finding is nonspecific, and can be secondary to many other
etiologies including hepatitis and diffuse hepatocellular disease.

Unremarkable sonographic appearance of hepatic parenchyma. No
evidence of biliary ductal dilatation.

## 2021-07-21 IMAGING — CT CT ABD-PELV W/ CM
2 of 5 series · 16 of 46 positions shown, 18 images · IV contrast (APPLIED)
Comparison: None.

CLINICAL DATA: Abdominal pain with fever

EXAM:
CT ABDOMEN AND PELVIS WITH CONTRAST
TECHNIQUE: Multidetector CT imaging of the abdomen and pelvis was performed
using the standard protocol following bolus administration of
intravenous contrast.
CONTRAST:  100mL OMNIPAQUE IOHEXOL 300 MG/ML  SOLN

[Series 2: axial st · axial · 0.69mm/px · z∈[-581,-151]mm · 13 of 98 slices shown, 15 images]
[im 6/98  soft-tissue]
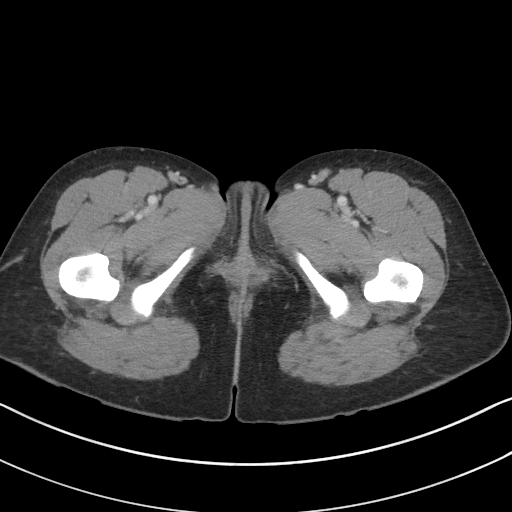
[im 6/98  bone]
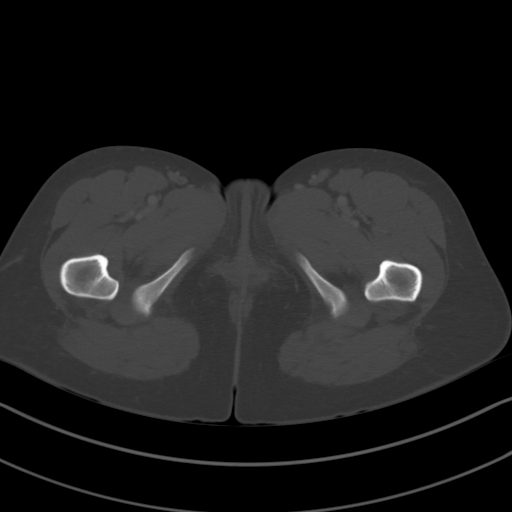
[im 16/98  soft-tissue]
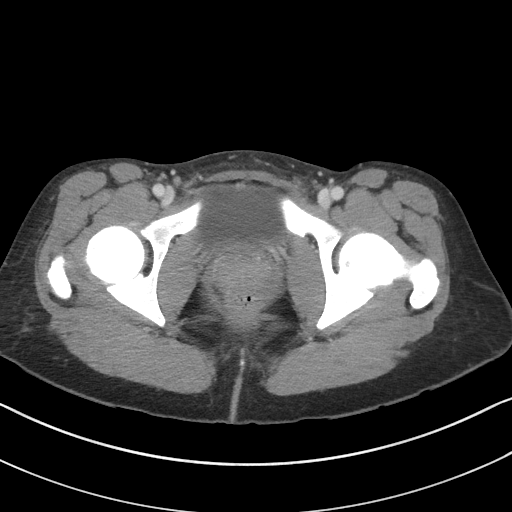
[im 21/98  soft-tissue]
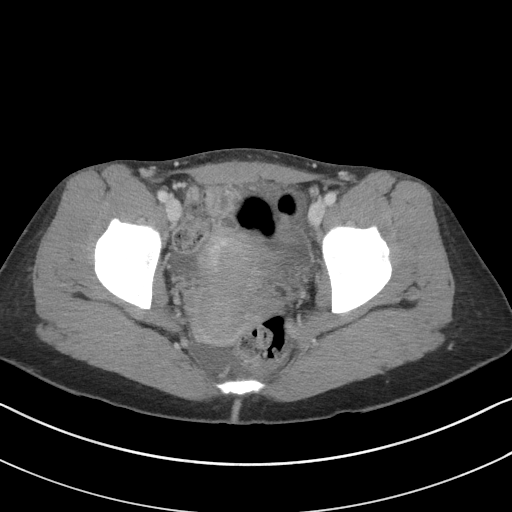
[im 26/98  soft-tissue]
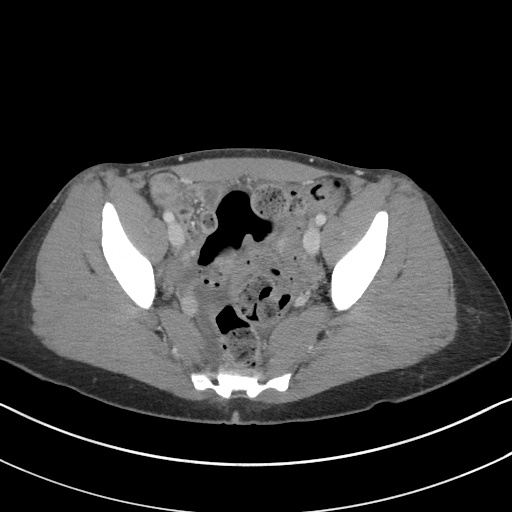
[im 36/98  soft-tissue]
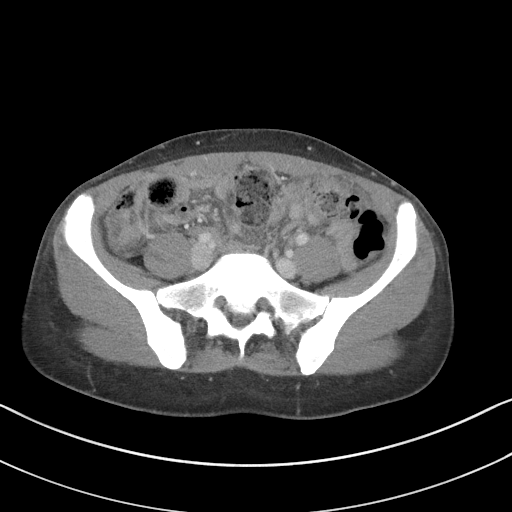
[im 41/98  soft-tissue]
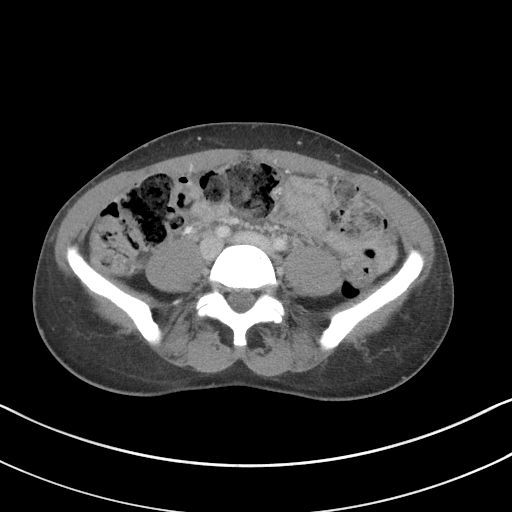
[im 52/98  soft-tissue]
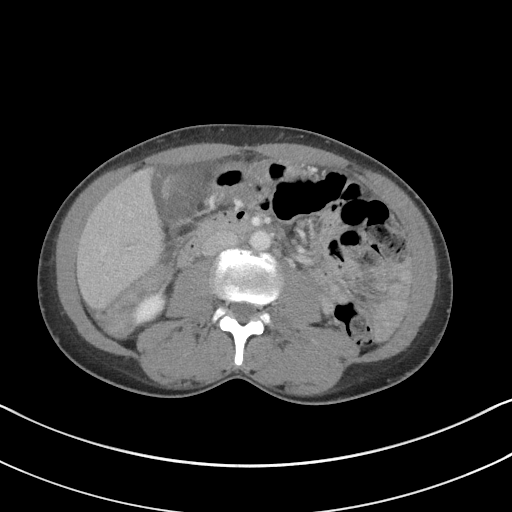
[im 57/98  soft-tissue]
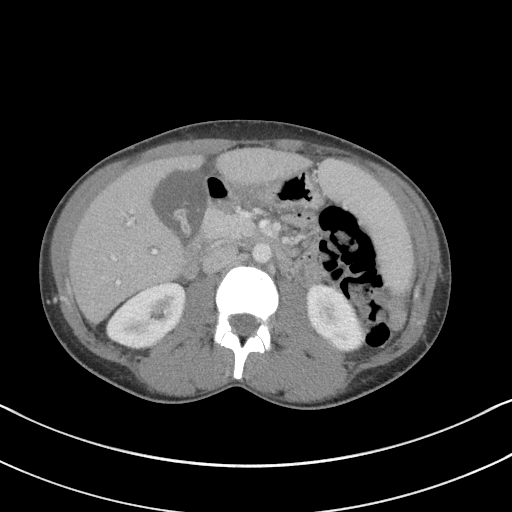
[im 62/98  soft-tissue]
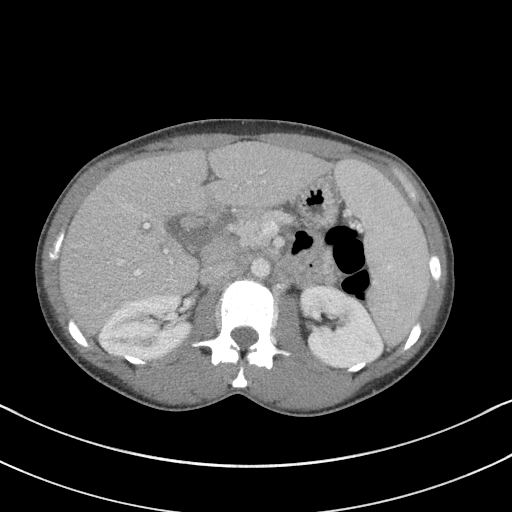
[im 62/98  bone]
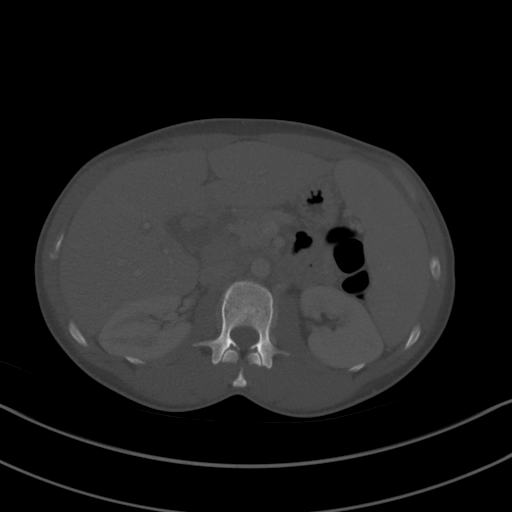
[im 72/98  soft-tissue]
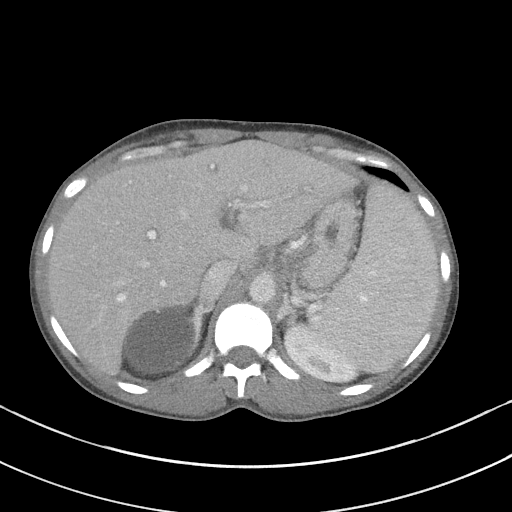
[im 77/98  soft-tissue]
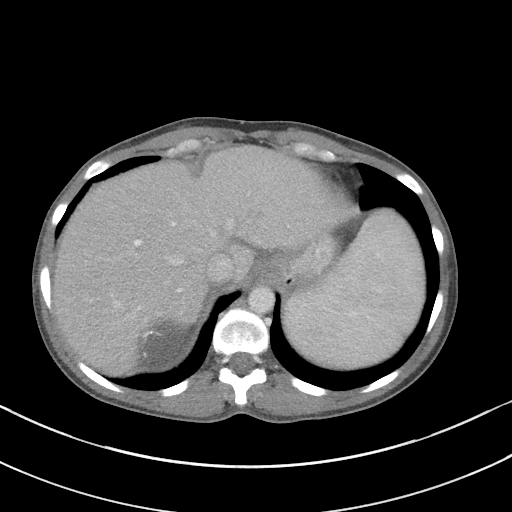
[im 82/98  soft-tissue]
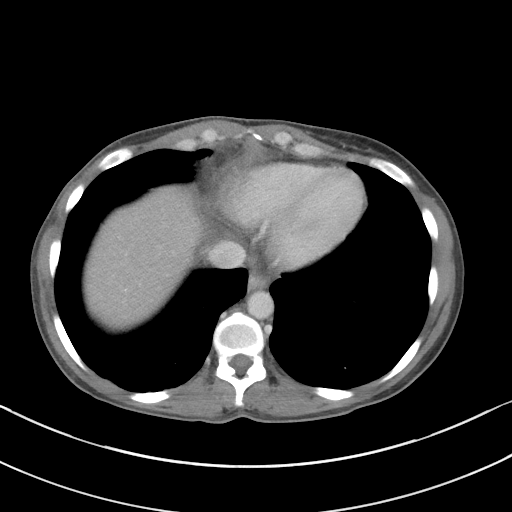
[im 92/98  soft-tissue]
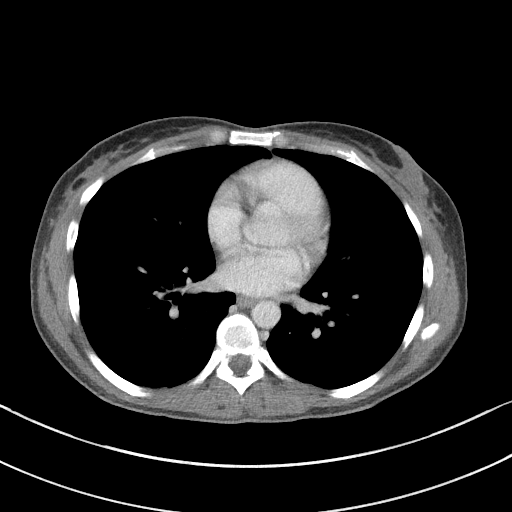

[Series 5: coronal st · coronal · 0.70mm/px · 3 of 67 slices shown]
[im 23/67  soft-tissue]
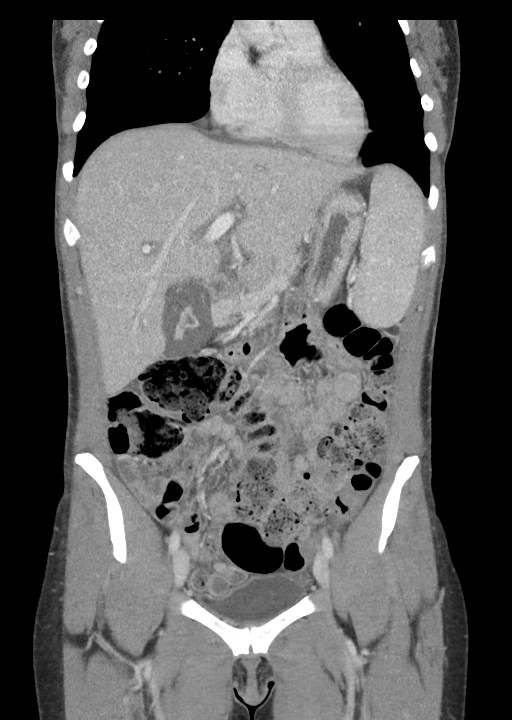
[im 30/67  soft-tissue]
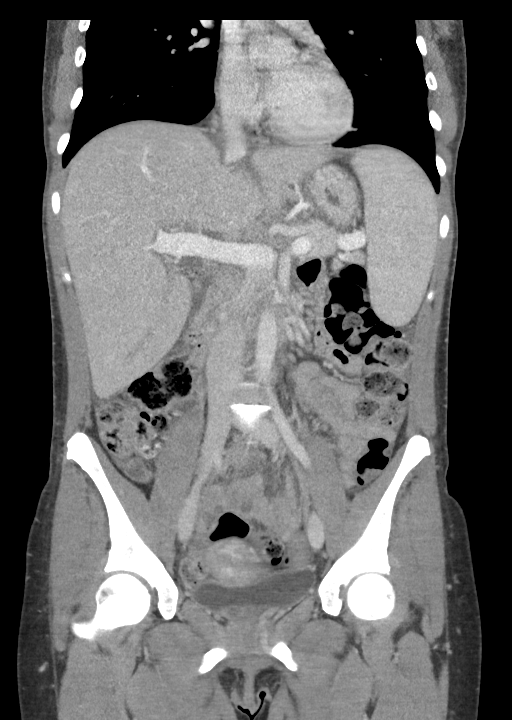
[im 37/67  soft-tissue]
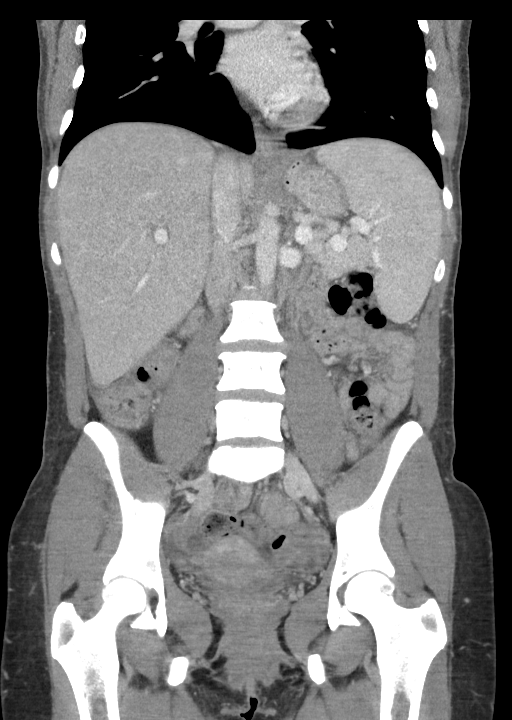

[16 of 46 positions shown; findings below may reference images not displayed]

FINDINGS: Lower chest: Lung bases demonstrate no acute consolidation or
effusion. The heart size is normal

Hepatobiliary: No focal hepatic abnormality. Mild periportal edema.
Abnormal appearance of gallbladder with what appears to be severe
wall thickening. No calcified stone. No biliary dilatation

Pancreas: Unremarkable. No pancreatic ductal dilatation or
surrounding inflammatory changes.

Spleen: Upper normal in size at 13 cm.

Adrenals/Urinary Tract: 5.6 cm low attenuation right adrenal mass
with peripheral calcification. Small stone measuring 4 mm in the mid
right kidney. The bladder is unremarkable

Stomach/Bowel: Stomach is within normal limits. Appendix not well
seen but no right lower quadrant inflammatory process. No evidence
of bowel wall thickening, distention, or inflammatory changes.

Vascular/Lymphatic: Nonaneurysmal aorta. No significantly enlarged
lymph nodes

Reproductive: Uterus and bilateral adnexa are unremarkable.

Other: No free air.  Small free fluid in the pelvis

Musculoskeletal: No acute or significant osseous findings.
IMPRESSION: 1. Abnormal appearance of the gallbladder with what appears to be
severe wall thickening, consider correlation with right upper
quadrant abdominal ultrasound.
2. 5.6 cm low-attenuation lesion in the right adrenal gland either
representing a cyst or adenoma.
3. 4 mm stone in the right kidney.  No hydronephrosis.
